# Patient Record
Sex: Male | Born: 1940 | Race: White | Hispanic: No | Smoking: Former smoker
Health system: Southern US, Community
[De-identification: ages and names within clinical notes are randomized; demographics above are authoritative.]

## PROBLEM LIST (undated history)

## (undated) DIAGNOSIS — Z789 Other specified health status: Secondary | ICD-10-CM

## (undated) DIAGNOSIS — U071 COVID-19: Secondary | ICD-10-CM

## (undated) DIAGNOSIS — I493 Ventricular premature depolarization: Secondary | ICD-10-CM

## (undated) HISTORY — DX: Ventricular premature depolarization: I49.3

## (undated) HISTORY — DX: Other specified health status: Z78.9

## (undated) HISTORY — PX: OTHER SURGICAL HISTORY: SHX169

---

## 1997-12-15 ENCOUNTER — Emergency Department (HOSPITAL_COMMUNITY): Admission: EM | Admit: 1997-12-15 | Discharge: 1997-12-15 | Payer: Self-pay | Admitting: Internal Medicine

## 2001-12-30 ENCOUNTER — Encounter (INDEPENDENT_AMBULATORY_CARE_PROVIDER_SITE_OTHER): Payer: Self-pay | Admitting: Specialist

## 2001-12-30 ENCOUNTER — Ambulatory Visit (HOSPITAL_COMMUNITY): Admission: RE | Admit: 2001-12-30 | Discharge: 2001-12-30 | Payer: Self-pay | Admitting: Internal Medicine

## 2010-05-22 ENCOUNTER — Ambulatory Visit (HOSPITAL_COMMUNITY)
Admission: RE | Admit: 2010-05-22 | Discharge: 2010-05-22 | Payer: Self-pay | Source: Home / Self Care | Attending: Internal Medicine | Admitting: Internal Medicine

## 2010-11-27 ENCOUNTER — Other Ambulatory Visit: Payer: Self-pay | Admitting: Dermatology

## 2011-09-03 ENCOUNTER — Encounter: Payer: Self-pay | Admitting: Internal Medicine

## 2011-10-03 ENCOUNTER — Ambulatory Visit (AMBULATORY_SURGERY_CENTER): Payer: Commercial Managed Care - PPO | Admitting: *Deleted

## 2011-10-03 ENCOUNTER — Encounter: Payer: Self-pay | Admitting: Internal Medicine

## 2011-10-03 VITALS — Ht 71.0 in | Wt 219.5 lb

## 2011-10-03 DIAGNOSIS — Z1211 Encounter for screening for malignant neoplasm of colon: Secondary | ICD-10-CM

## 2011-10-03 MED ORDER — PEG-KCL-NACL-NASULF-NA ASC-C 100 G PO SOLR: ORAL | Status: DC

## 2011-10-17 ENCOUNTER — Ambulatory Visit (AMBULATORY_SURGERY_CENTER): Payer: Commercial Managed Care - PPO | Admitting: Internal Medicine

## 2011-10-17 ENCOUNTER — Encounter: Payer: Self-pay | Admitting: Internal Medicine

## 2011-10-17 VITALS — BP 142/84 | HR 87 | Temp 97.8°F | Resp 19 | Ht 71.0 in | Wt 219.0 lb

## 2011-10-17 DIAGNOSIS — K5730 Diverticulosis of large intestine without perforation or abscess without bleeding: Secondary | ICD-10-CM

## 2011-10-17 DIAGNOSIS — Z1211 Encounter for screening for malignant neoplasm of colon: Secondary | ICD-10-CM

## 2011-10-17 DIAGNOSIS — D126 Benign neoplasm of colon, unspecified: Secondary | ICD-10-CM

## 2011-10-17 DIAGNOSIS — Z860100 Personal history of colon polyps, unspecified: Secondary | ICD-10-CM

## 2011-10-17 MED ORDER — SODIUM CHLORIDE 0.9 % IV SOLN: 500.0000 mL | INTRAVENOUS | Status: DC

## 2011-10-17 NOTE — Patient Instructions (Signed)
YOU HAD AN ENDOSCOPIC PROCEDURE TODAY AT THE [facility]: Refer to the procedure report that was given to you for any specific questions about what was found during the examination.  If the procedure report does not answer your questions, please call your gastroenterologist to clarify.  If you requested that your care partner not be given the details of your procedure findings, then the procedure report has been included in a sealed envelope for you to review at your convenience later.  YOU SHOULD EXPECT: Some feelings of bloating in the abdomen. Passage of more gas than usual.  Walking can help get rid of the air that was put into your GI tract during the procedure and reduce the bloating. If you had a lower endoscopy (such as a colonoscopy or flexible sigmoidoscopy) you may notice spotting of blood in your stool or on the toilet paper. If you underwent a bowel prep for your procedure, then you may not have a normal bowel movement for a few days.  DIET: Your first meal following the procedure should be a light meal and then it is ok to progress to your normal diet.  A half-sandwich or bowl of soup is an example of a good first meal.  Heavy or fried foods are harder to digest and may make you feel nauseous or bloated.  Likewise meals heavy in dairy and vegetables can cause extra gas to form and this can also increase the bloating.  Drink plenty of fluids but you should avoid alcoholic beverages for 24 hours.  ACTIVITY: Your care partner should take you home directly after the procedure.  You should plan to take it easy, moving slowly for the rest of the day.  You can resume normal activity the day after the procedure however you should NOT DRIVE or use heavy machinery for 24 hours (because of the sedation medicines used during the test).    SYMPTOMS TO REPORT IMMEDIATELY: A gastroenterologist can be reached at any hour.  During normal business hours, 8:30 AM to 5:00 PM Monday through Friday,  call [phone].  After hours and on weekends, please call the GI answering service at [phone] who will take a message and have the physician on call contact you.   Following lower endoscopy (colonoscopy or flexible sigmoidoscopy):  Excessive amounts of blood in the stool  Significant tenderness or worsening of abdominal pains  Swelling of the abdomen that is new, acute  Fever of 100F or higher  FOLLOW UP: If any biopsies were taken you will be contacted by phone or by letter within the next 1-3 weeks.  Call your gastroenterologist if you have not heard about the biopsies in 3 weeks.  Our staff will call the home number listed on your records the next business day following your procedure to check on you and address any questions or concerns that you may have at that time regarding the information given to you following your procedure. This is a courtesy call and so if there is no answer at the home number and we have not heard from you through the emergency physician on call, we will assume that you have returned to your regular daily activities without incident.  SIGNATURES/CONFIDENTIALITY: You and/or your care partner have signed paperwork which will be entered into your electronic medical record.  These signatures attest to the fact that that the information above on your After Visit Summary has been reviewed and is understood.  Full responsibility of the confidentiality of this   discharge information lies with you and/or your care-partner.  Resume medications. Information given on polyps,hemorrhoids, diverticulosis and high fiber diet. 

## 2011-10-17 NOTE — Op Note (Signed)
[facility] [address] [place], [place]  [address]  COLONOSCOPY PROCEDURE REPORT  PATIENT:  Raymond, Camacho  MR#:  [id number] BIRTHDATE:  10-Oct-1940, 70 yrs. old  GENDER:  male ENDOSCOPIST:  Wilhemina Bonito. Eda Keys, MD REF. BY:  Surveillance Program Recall, PROCEDURE DATE:  10/17/2011 PROCEDURE:  Colonoscopy with snare polypectomy x 1 ASA CLASS:  Class II INDICATIONS:  history of pre-cancerous (adenomatous) colon polyps, surveillance and high-risk screening ; index 2003 w/ TA (overdue for f/u) MEDICATIONS:   MAC sedation, administered by CRNA, propofol (Diprivan) 300 mg IV  DESCRIPTION OF PROCEDURE:   After the risks benefits and alternatives of the procedure were thoroughly explained, informed consent was obtained.  Digital rectal exam was performed and revealed no abnormalities.   The LB CF-[id number] endoscope was introduced through the anus and advanced to the cecum, which was identified by both the appendix and ileocecal valve, without limitations.  The quality of the prep was good, using MoviPrep. The instrument was then slowly withdrawn as the colon was fully examined. <<PROCEDUREIMAGES>>  FINDINGS:  The terminal ileum appeared normal.  A diminutive polyp was found in the mid transverse colon and snared without cautery. Retrieval was successful. Severe diverticulosis was found throughout the colon.  Otherwise normal colonoscopy without other polyps, masses, vascular ectasias, or inflammatory changes. Retroflexed views in the rectum revealed internal hemorrhoids. The time to cecum = 1:30  minutes. The scope was then withdrawn in 13:25  minutes from the cecum and the procedure completed.  COMPLICATIONS:  None  ENDOSCOPIC IMPRESSION: 1) Normal terminal ileum 2) Diminutive polyp in the mid transverse colon - removed 3) Severe diverticulosis throughout the colon 4) Otherwise normal colonoscopy 5) Internal hemorrhoids  RECOMMENDATIONS: 1) Follow up  colonoscopy in 5 years  ______________________________ Wilhemina Bonito. Eda Keys, MD  CC:  Geoffry Paradise, MD;  The Patient  n. eSIGNED:   Wilhemina Bonito. Eda Keys at 10/17/2011 08:43 AM  Craig Guess, [id number]

## 2011-10-17 NOTE — Progress Notes (Signed)
Patient did not experience any of the following events: a burn prior to discharge; a fall within the facility; wrong site/side/patient/procedure/implant event; or a hospital transfer or hospital admission upon discharge from the facility. (G8907) Patient did not have preoperative order for IV antibiotic SSI prophylaxis. (G8918)  

## 2011-10-18 ENCOUNTER — Telehealth: Payer: Self-pay | Admitting: *Deleted

## 2011-10-18 NOTE — Telephone Encounter (Signed)
  Follow up Call-  Call back number 10/17/2011  Post procedure Call Back phone  # [phone]  Permission to leave phone message Yes     Patient questions:  Do you have a fever, pain , or abdominal swelling? no Pain Score  0 *  Have you tolerated food without any problems? yes  Have you been able to return to your normal activities? yes  Do you have any questions about your discharge instructions: Diet   no Medications  no Follow up visit  no  Do you have questions or concerns about your Care? no  Actions: * If pain score is 4 or above: No action needed, pain <4.

## 2011-10-22 ENCOUNTER — Encounter: Payer: Self-pay | Admitting: Internal Medicine

## 2013-03-18 ENCOUNTER — Ambulatory Visit (INDEPENDENT_AMBULATORY_CARE_PROVIDER_SITE_OTHER): Payer: Commercial Managed Care - PPO | Admitting: Cardiology

## 2013-03-18 ENCOUNTER — Encounter: Payer: Self-pay | Admitting: Cardiology

## 2013-03-18 ENCOUNTER — Encounter (HOSPITAL_COMMUNITY): Payer: Self-pay | Admitting: *Deleted

## 2013-03-18 VITALS — BP 142/86 | HR 97 | Ht 71.0 in | Wt 223.0 lb

## 2013-03-18 DIAGNOSIS — R002 Palpitations: Secondary | ICD-10-CM | POA: Insufficient documentation

## 2013-03-18 DIAGNOSIS — I493 Ventricular premature depolarization: Secondary | ICD-10-CM

## 2013-03-18 DIAGNOSIS — I4949 Other premature depolarization: Secondary | ICD-10-CM

## 2013-03-18 DIAGNOSIS — Z87891 Personal history of nicotine dependence: Secondary | ICD-10-CM | POA: Insufficient documentation

## 2013-03-18 DIAGNOSIS — Z8249 Family history of ischemic heart disease and other diseases of the circulatory system: Secondary | ICD-10-CM

## 2013-03-18 DIAGNOSIS — E669 Obesity, unspecified: Secondary | ICD-10-CM | POA: Insufficient documentation

## 2013-03-18 DIAGNOSIS — R9431 Abnormal electrocardiogram [ECG] [EKG]: Secondary | ICD-10-CM

## 2013-03-18 NOTE — Assessment & Plan Note (Signed)
Snores  but no other symptoms of sleep apnea

## 2013-03-18 NOTE — Assessment & Plan Note (Signed)
F- MI in his 50's 

## 2013-03-18 NOTE — Assessment & Plan Note (Signed)
Noted on EKG by primary MD

## 2013-03-18 NOTE — Patient Instructions (Signed)
Your physician recommends that you schedule a follow-up appointment in:2 weeks with Dr Allyson Sabal Your physician has requested that you have an echocardiogram. Echocardiography is a painless test that uses sound waves to create images of your heart. It provides your doctor with information about the size and shape of your heart and how well your heart's chambers and valves are working. This procedure takes approximately one hour. There are no restrictions for this procedure. Your physician has requested that you have en exercise stress myoview. For further information please visit [email or web address] Please follow instruction sheet, as given.

## 2013-03-18 NOTE — Progress Notes (Signed)
03/18/2013 Mardee Postin   12/24/1940  [id number]  Primary Physicia ARONSON,Jamus A, MD Primary Cardiologist: Dr Allyson Sabal (new)  HPI:  72 y/o with no prior history of any heart trouble except palpitations (which have actually been stable the past few years), presented to Dr Jacky Kindle today. His EKG showed NSR with frequent unifocal PVCs. The pt denies any chest pain or SOB. He denies syncope or near syncope.    Current Outpatient Prescriptions  Medication Sig Dispense Refill  . aspirin 81 MG tablet Take 81 mg by mouth daily.      . Omega-3 Fatty Acids (FISH OIL CONCENTRATE PO) Take by mouth daily.       No current facility-administered medications for this visit.    Allergies  Allergen Reactions  . Sulfa Antibiotics Nausea And Vomiting    History   Social History  . Marital Status: Married    Spouse Name: N/A    Number of Children: N/A  . Years of Education: N/A   Occupational History  . Not on file.   Social History Main Topics  . Smoking status: Former Smoker    Quit date: 10/03/1978  . Smokeless tobacco: Never Used  . Alcohol Use: 1.8 oz/week    3 Cans of beer per week  . Drug Use: No  . Sexual Activity: Not on file   Other Topics Concern  . Not on file   Social History Narrative  . No narrative on file     Review of Systems: General: negative for chills, fever, night sweats or weight changes.  Cardiovascular: negative for chest pain, dyspnea on exertion, edema, orthopnea, palpitations, paroxysmal nocturnal dyspnea or shortness of breath Dermatological: negative for rash Respiratory: negative for cough or wheezing Urologic: negative for hematuria, remote kidney stone Abdominal: negative for nausea, vomiting, diarrhea, bright red blood per rectum, melena, or hematemesis Neurologic: negative for visual changes, syncope, or dizziness He snores at night but denies daytime sleepiness All other systems reviewed and are otherwise negative except as noted  above.    Blood pressure 142/86, pulse 97, height 5\' 11"  (1.803 m), weight 223 lb (101.152 kg).  General appearance: alert, cooperative, no distress and moderately obese Neck: no carotid bruit and no JVD Lungs: clear to auscultation bilaterally Heart: regular rate and rhythm, S1, S2 normal, no murmur, click, rub or gallop Abdomen: obese Extremities: extremities normal, atraumatic, no cyanosis or edema Pulses: 2+ and symmetric Skin: Skin color, texture, turgor normal. No rashes or lesions Neurologic: Grossly normal  EKG NSR PVCs  ASSESSMENT AND PLAN:   Palpitations Lifelong problem but worse last few weeks  PVC (premature ventricular contraction) Noted on EKG by primary MD  Smoking history- former heavy smoker He has not smoked since his early 35s but was a 2-3 pkpd smoker  Family history of coronary artery disease F- MI in his 7s  Obesity (BMI 30-39.9) Snores  but no other symptoms of sleep apnea    PLAN  Pt seen by Dr Allyson Sabal and myself- plan echo and Myoview.  [facility] KPA-C 03/18/2013 3:31 PM

## 2013-03-18 NOTE — Assessment & Plan Note (Signed)
Lifelong problem but worse last few weeks

## 2013-03-18 NOTE — Assessment & Plan Note (Signed)
He has not smoked since his early 71s but was a 2-3 pkpd smoker

## 2013-03-20 ENCOUNTER — Encounter: Payer: Self-pay | Admitting: Cardiovascular Disease

## 2013-03-24 ENCOUNTER — Ambulatory Visit (HOSPITAL_COMMUNITY): Payer: Commercial Managed Care - PPO

## 2013-03-26 ENCOUNTER — Telehealth (HOSPITAL_COMMUNITY): Payer: Self-pay | Admitting: *Deleted

## 2013-03-27 ENCOUNTER — Encounter (HOSPITAL_COMMUNITY): Payer: Commercial Managed Care - PPO

## 2013-04-07 ENCOUNTER — Ambulatory Visit: Payer: Commercial Managed Care - PPO | Admitting: Cardiology

## 2016-08-27 ENCOUNTER — Encounter: Payer: Self-pay | Admitting: Internal Medicine

## 2019-04-19 ENCOUNTER — Emergency Department (HOSPITAL_COMMUNITY): Payer: No Typology Code available for payment source

## 2019-04-19 ENCOUNTER — Encounter (HOSPITAL_COMMUNITY): Payer: Self-pay | Admitting: Emergency Medicine

## 2019-04-19 ENCOUNTER — Inpatient Hospital Stay (HOSPITAL_COMMUNITY)
Admission: EM | Admit: 2019-04-19 | Discharge: 2019-04-23 | DRG: 064 | Disposition: A | Discharge status: Home / Self Care | Payer: No Typology Code available for payment source | Attending: Family Medicine | Admitting: Family Medicine

## 2019-04-19 ENCOUNTER — Other Ambulatory Visit: Payer: Self-pay

## 2019-04-19 DIAGNOSIS — N179 Acute kidney failure, unspecified: Secondary | ICD-10-CM | POA: Diagnosis present

## 2019-04-19 DIAGNOSIS — Z23 Encounter for immunization: Secondary | ICD-10-CM

## 2019-04-19 DIAGNOSIS — I6389 Other cerebral infarction: Secondary | ICD-10-CM | POA: Diagnosis not present

## 2019-04-19 DIAGNOSIS — G459 Transient cerebral ischemic attack, unspecified: Secondary | ICD-10-CM | POA: Diagnosis not present

## 2019-04-19 DIAGNOSIS — Z87891 Personal history of nicotine dependence: Secondary | ICD-10-CM | POA: Diagnosis not present

## 2019-04-19 DIAGNOSIS — J1289 Other viral pneumonia: Secondary | ICD-10-CM | POA: Diagnosis present

## 2019-04-19 DIAGNOSIS — U071 COVID-19: Secondary | ICD-10-CM

## 2019-04-19 DIAGNOSIS — Z6828 Body mass index (BMI) 28.0-28.9, adult: Secondary | ICD-10-CM | POA: Diagnosis not present

## 2019-04-19 DIAGNOSIS — Z882 Allergy status to sulfonamides status: Secondary | ICD-10-CM | POA: Diagnosis not present

## 2019-04-19 DIAGNOSIS — E669 Obesity, unspecified: Secondary | ICD-10-CM | POA: Diagnosis present

## 2019-04-19 DIAGNOSIS — I639 Cerebral infarction, unspecified: Secondary | ICD-10-CM | POA: Diagnosis present

## 2019-04-19 DIAGNOSIS — I6340 Cerebral infarction due to embolism of unspecified cerebral artery: Secondary | ICD-10-CM | POA: Diagnosis present

## 2019-04-19 DIAGNOSIS — J9691 Respiratory failure, unspecified with hypoxia: Secondary | ICD-10-CM

## 2019-04-19 DIAGNOSIS — I493 Ventricular premature depolarization: Secondary | ICD-10-CM | POA: Diagnosis present

## 2019-04-19 DIAGNOSIS — Z20828 Contact with and (suspected) exposure to other viral communicable diseases: Secondary | ICD-10-CM | POA: Diagnosis present

## 2019-04-19 DIAGNOSIS — D6859 Other primary thrombophilia: Secondary | ICD-10-CM | POA: Diagnosis present

## 2019-04-19 DIAGNOSIS — Z8619 Personal history of other infectious and parasitic diseases: Secondary | ICD-10-CM

## 2019-04-19 DIAGNOSIS — R2990 Unspecified symptoms and signs involving the nervous system: Secondary | ICD-10-CM

## 2019-04-19 DIAGNOSIS — J1282 Pneumonia due to coronavirus disease 2019: Secondary | ICD-10-CM

## 2019-04-19 DIAGNOSIS — Z7982 Long term (current) use of aspirin: Secondary | ICD-10-CM

## 2019-04-19 DIAGNOSIS — Z8249 Family history of ischemic heart disease and other diseases of the circulatory system: Secondary | ICD-10-CM

## 2019-04-19 DIAGNOSIS — J9601 Acute respiratory failure with hypoxia: Secondary | ICD-10-CM | POA: Diagnosis present

## 2019-04-19 DIAGNOSIS — Z79899 Other long term (current) drug therapy: Secondary | ICD-10-CM

## 2019-04-19 DIAGNOSIS — I6300 Cerebral infarction due to thrombosis of unspecified precerebral artery: Secondary | ICD-10-CM | POA: Diagnosis not present

## 2019-04-19 HISTORY — DX: COVID-19: U07.1

## 2019-04-19 LAB — CBC
HCT: 40.3 % (ref 39.0–52.0)
HCT: 39.7 % (ref 39.0–52.0)
Hemoglobin: 13.7 g/dL (ref 13.0–17.0)
Hemoglobin: 13.5 g/dL (ref 13.0–17.0)
MCH: 30.2 pg (ref 26.0–34.0)
MCH: 30.5 pg (ref 26.0–34.0)
MCHC: 34 g/dL (ref 30.0–36.0)
MCHC: 34 g/dL (ref 30.0–36.0)
MCV: 89 fL (ref 80.0–100.0)
MCV: 89.8 fL (ref 80.0–100.0)
Platelets: 358 10*3/uL (ref 150–400)
Platelets: 435 10*3/uL — ABNORMAL HIGH (ref 150–400)
RBC: 4.53 MIL/uL (ref 4.22–5.81)
RBC: 4.42 MIL/uL (ref 4.22–5.81)
RDW: 12.2 % (ref 11.5–15.5)
RDW: 12.5 % (ref 11.5–15.5)
WBC: 9.5 10*3/uL (ref 4.0–10.5)
WBC: 8.9 10*3/uL (ref 4.0–10.5)
nRBC: 0 % (ref 0.0–0.2)
nRBC: 0 % (ref 0.0–0.2)

## 2019-04-19 LAB — DIFFERENTIAL
Abs Immature Granulocytes: 0.11 10*3/uL — ABNORMAL HIGH (ref 0.00–0.07)
Basophils Absolute: 0 10*3/uL (ref 0.0–0.1)
Basophils Relative: 0 %
Eosinophils Absolute: 0.1 10*3/uL (ref 0.0–0.5)
Eosinophils Relative: 1 %
Immature Granulocytes: 1 %
Lymphocytes Relative: 14 %
Lymphs Abs: 1.4 10*3/uL (ref 0.7–4.0)
Monocytes Absolute: 1.4 10*3/uL — ABNORMAL HIGH (ref 0.1–1.0)
Monocytes Relative: 14 %
Neutro Abs: 6.5 10*3/uL (ref 1.7–7.7)
Neutrophils Relative %: 70 %

## 2019-04-19 LAB — C-REACTIVE PROTEIN: CRP: 11.4 mg/dL — ABNORMAL HIGH (ref <1.0)

## 2019-04-19 LAB — PROTIME-INR
INR: 1.2 (ref 0.8–1.2)
Prothrombin Time: 15.1 seconds (ref 11.4–15.2)

## 2019-04-19 LAB — COMPREHENSIVE METABOLIC PANEL
ALT: 74 U/L — ABNORMAL HIGH (ref 0–44)
AST: 52 U/L — ABNORMAL HIGH (ref 15–41)
Albumin: 2.5 g/dL — ABNORMAL LOW (ref 3.5–5.0)
Alkaline Phosphatase: 63 U/L (ref 38–126)
Anion gap: 12 (ref 5–15)
BUN: 31 mg/dL — ABNORMAL HIGH (ref 8–23)
CO2: 22 mmol/L (ref 22–32)
Calcium: 8.8 mg/dL — ABNORMAL LOW (ref 8.9–10.3)
Chloride: 102 mmol/L (ref 98–111)
Creatinine, Ser: 1.19 mg/dL (ref 0.61–1.24)
GFR calc Af Amer: >60 mL/min (ref >60)
GFR calc non Af Amer: 58 mL/min — ABNORMAL LOW (ref >60)
Glucose, Bld: 92 mg/dL (ref 70–99)
Potassium: 4.4 mmol/L (ref 3.5–5.1)
Sodium: 136 mmol/L (ref 135–145)
Total Bilirubin: 0.8 mg/dL (ref 0.3–1.2)
Total Protein: 6.6 g/dL (ref 6.5–8.1)

## 2019-04-19 LAB — CREATININE, SERUM
Creatinine, Ser: 1.32 mg/dL — ABNORMAL HIGH (ref 0.61–1.24)
GFR calc Af Amer: 59 mL/min — ABNORMAL LOW (ref >60)
GFR calc non Af Amer: 51 mL/min — ABNORMAL LOW (ref >60)

## 2019-04-19 LAB — ABO/RH: ABO/RH(D): O POS

## 2019-04-19 LAB — HEMOGLOBIN A1C
Hgb A1c MFr Bld: 6.6 % — ABNORMAL HIGH (ref 4.8–5.6)
Mean Plasma Glucose: 142.72 mg/dL

## 2019-04-19 LAB — FERRITIN: Ferritin: 862 ng/mL — ABNORMAL HIGH (ref 24–336)

## 2019-04-19 LAB — APTT: aPTT: 36 seconds (ref 24–36)

## 2019-04-19 LAB — SARS CORONAVIRUS 2 (TAT 6-24 HRS): SARS Coronavirus 2: NEGATIVE

## 2019-04-19 MED ORDER — SODIUM CHLORIDE 0.9 % IV SOLN
100.0000 mg | Freq: Every day | INTRAVENOUS | Status: AC
  Administered 2019-04-20 – 2019-04-23 (×4): 100 mg via INTRAVENOUS
  Filled 2019-04-19: qty 20
  Filled 2019-04-19: qty 100
  Filled 2019-04-19: qty 20
  Filled 2019-04-19: qty 100

## 2019-04-19 MED ORDER — ZINC SULFATE 220 (50 ZN) MG PO CAPS
220.0000 mg | ORAL_CAPSULE | Freq: Every day | ORAL | Status: DC
  Administered 2019-04-19 – 2019-04-23 (×5): 220 mg via ORAL
  Filled 2019-04-19 (×5): qty 1

## 2019-04-19 MED ORDER — ACETAMINOPHEN 325 MG PO TABS: 650.0000 mg | ORAL_TABLET | Freq: Four times a day (QID) | ORAL | Status: DC | PRN

## 2019-04-19 MED ORDER — ENOXAPARIN SODIUM 40 MG/0.4ML ~~LOC~~ SOLN
40.0000 mg | SUBCUTANEOUS | Status: DC
  Administered 2019-04-19: 40 mg via SUBCUTANEOUS
  Filled 2019-04-19: qty 0.4

## 2019-04-19 MED ORDER — ATORVASTATIN CALCIUM 80 MG PO TABS
80.0000 mg | ORAL_TABLET | Freq: Every day | ORAL | Status: DC
  Administered 2019-04-20: 80 mg via ORAL
  Filled 2019-04-19 (×2): qty 1

## 2019-04-19 MED ORDER — SODIUM CHLORIDE 0.9% FLUSH: 3.0000 mL | Freq: Once | INTRAVENOUS | Status: DC

## 2019-04-19 MED ORDER — ALBUTEROL SULFATE HFA 108 (90 BASE) MCG/ACT IN AERS
2.0000 | INHALATION_SPRAY | Freq: Four times a day (QID) | RESPIRATORY_TRACT | Status: DC
  Administered 2019-04-19 – 2019-04-23 (×14): 2 via RESPIRATORY_TRACT
  Filled 2019-04-19 (×2): qty 6.7

## 2019-04-19 MED ORDER — ASPIRIN EC 325 MG PO TBEC
325.0000 mg | DELAYED_RELEASE_TABLET | Freq: Every day | ORAL | Status: DC
  Administered 2019-04-19 – 2019-04-20 (×2): 325 mg via ORAL
  Filled 2019-04-19 (×2): qty 1

## 2019-04-19 MED ORDER — SODIUM CHLORIDE 0.9 % IV SOLN
200.0000 mg | Freq: Once | INTRAVENOUS | Status: AC
  Administered 2019-04-19: 19:00:00 200 mg via INTRAVENOUS
  Filled 2019-04-19: qty 40

## 2019-04-19 MED ORDER — ACETAMINOPHEN 650 MG RE SUPP: 650.0000 mg | Freq: Four times a day (QID) | RECTAL | Status: DC | PRN

## 2019-04-19 MED ORDER — VITAMIN C 500 MG PO TABS
1000.0000 mg | ORAL_TABLET | Freq: Every day | ORAL | Status: DC
  Administered 2019-04-19 – 2019-04-23 (×5): 1000 mg via ORAL
  Filled 2019-04-19 (×5): qty 2

## 2019-04-19 MED ORDER — DEXAMETHASONE 6 MG PO TABS
6.0000 mg | ORAL_TABLET | ORAL | Status: DC
  Administered 2019-04-19 – 2019-04-22 (×4): 6 mg via ORAL
  Filled 2019-04-19 (×3): qty 1
  Filled 2019-04-19 (×2): qty 2

## 2019-04-19 NOTE — ED Provider Notes (Signed)
Care of patient assumed from [facility] at 3:40 PM.  Agree with history, physical exam and plan.  See their note for further details.  Briefly, 78 y.o. male with PMH/PSH as below who presents with L hand and leg weakness.   24h ago (3P yesterday); L sided hand weakness that resolved after 81min; also has had L sided leg weakness that patient still appreciates, but not appreciated on exam  Of note, had Covid + one week ago, mild symptoms  CTH normal  Per neuro: will obtain MRI  Past Medical History:  Diagnosis Date  . COVID-19   . Current non-smoker but past smoking history unknown   . PVC's (premature ventricular contractions)    Past Surgical History:  Procedure Laterality Date  . no prior surgery        Current Plan: MR Brain and discuss with neuro    MDM/ED Course: Patient resting in bed in no acute distress No appreciable weakness on examination the patient states he feels like he has poor coordination when raising and lowering his left leg.  Neurology has seen and evaluated the patient and recommends admission to general medicine for TIA workup.   Regarding recent Covid 19 infection, CXR with scattered opacities. Pt is comfortable on RA in no acute respiratory distress.   Hospitalist contacted for admission. Patient awaiting MR transport. Care assumed by hospitalist. No further acute events.    Consults: Neurology    Significant labs/images:  I personally reviewed and interpreted all labs.  The plan for this patient was discussed with Dr. Steffanie Dunn, who voiced agreement and who oversaw evaluation and treatment of this patient.    Burns Spain, MD 04/19/19 [id number]    Elnora Morrison, MD 04/20/19 [phone]

## 2019-04-19 NOTE — Consult Note (Addendum)
Neurology consult  Reason for Consult: Left-sided weakness Referring Physician: Dr. Rudene Re Raymond Camacho is an 78 y.o. male.  HPI: 78 year old man, veteran of the [employer], past medical history of PVCs, recent diagnosis of COVID-19 in April 10, 2019 when he got tested because of some respiratory symptoms but did not require hospitalization, with greater than 24 hours last known normal presented for evaluation of left-sided weakness. He states that his left hand was unable to open a breakfast bar cover when he attempted to do that yesterday morning around 10 AM.  He then felt that he was not able to walk well with weakness of his left leg.  The left leg weakness started to improve but the arm remains weak for quite a while.  He could not give me an exact time.  He says that his arm still does not feel completely normal subjectively. Still continues to be short of breath after the Covid diagnosis in late November. Other review of system documented below  Past Medical History:  Diagnosis Date  . COVID-19   . Current non-smoker but past smoking history unknown   . PVC's (premature ventricular contractions)     Past Surgical History:  Procedure Laterality Date  . no prior surgery      Family History  Problem Relation Age of Onset  . CAD Father 31  . Colon cancer Neg Hx   . Stomach cancer Neg Hx     Social History:  reports that he quit smoking about 40 years ago. He has never used smokeless tobacco. He reports current alcohol use of about 3.0 standard drinks of alcohol per week. He reports that he does not use drugs.  Allergies:  Allergies  Allergen Reactions  . Sulfa Antibiotics Nausea And Vomiting    Medications: Prior to Admission: Aspirin, flecainide  Results for orders placed or performed during the hospital encounter of 04/19/19 (from the past 48 hour(s))  Protime-INR     Status: None   Collection Time: 04/19/19 12:16 PM  Result Value Ref Range   Prothrombin Time  15.1 11.4 - 15.2 seconds   INR 1.2 0.8 - 1.2    Comment: (NOTE) INR goal varies based on device and disease states. Performed at [facility], [place] [address] [place], [place] [address]   APTT     Status: None   Collection Time: 04/19/19 12:16 PM  Result Value Ref Range   aPTT 36 24 - 36 seconds    Comment: Performed at [facility] [address], [place] [address]  CBC     Status: None   Collection Time: 04/19/19 12:16 PM  Result Value Ref Range   WBC 9.5 4.0 - 10.5 K/uL   RBC 4.53 4.22 - 5.81 MIL/uL   Hemoglobin 13.7 13.0 - 17.0 g/dL   HCT 40.3 39.0 - 52.0 %   MCV 89.0 80.0 - 100.0 fL   MCH 30.2 26.0 - 34.0 pg   MCHC 34.0 30.0 - 36.0 g/dL   RDW 12.2 11.5 - 15.5 %   Platelets 358 150 - 400 K/uL   nRBC 0.0 0.0 - 0.2 %    Comment: Performed at [facility], [place] [address], [place], [place] [address]  Differential     Status: Abnormal   Collection Time: 04/19/19 12:16 PM  Result Value Ref Range   Neutrophils Relative % 70 %   Neutro Abs 6.5 1.7 - 7.7 K/uL   Lymphocytes Relative 14 %  Lymphs Abs 1.4 0.7 - 4.0 K/uL   Monocytes Relative 14 %   Monocytes Absolute 1.4 (H) 0.1 - 1.0 K/uL   Eosinophils Relative 1 %   Eosinophils Absolute 0.1 0.0 - 0.5 K/uL   Basophils Relative 0 %   Basophils Absolute 0.0 0.0 - 0.1 K/uL   Immature Granulocytes 1 %   Abs Immature Granulocytes 0.11 (H) 0.00 - 0.07 K/uL    Comment: Performed at [facility] [address], [place], [place] [address]  Comprehensive metabolic panel     Status: Abnormal   Collection Time: 04/19/19 12:16 PM  Result Value Ref Range   Sodium 136 135 - 145 mmol/L   Potassium 4.4 3.5 - 5.1 mmol/L   Chloride 102 98 - 111 mmol/L   CO2 22 22 - 32 mmol/L   Glucose, Bld 92 70 - 99 mg/dL   BUN 31 (H) 8 - 23 mg/dL   Creatinine, Ser 1.19 0.61 - 1.24 mg/dL   Calcium 8.8 (L) 8.9 - 10.3 mg/dL   Total Protein 6.6 6.5 - 8.1 g/dL   Albumin 2.5 (L) 3.5 - 5.0 g/dL   AST 52 (H) 15 - 41 U/L    ALT 74 (H) 0 - 44 U/L   Alkaline Phosphatase 63 38 - 126 U/L   Total Bilirubin 0.8 0.3 - 1.2 mg/dL   GFR calc non Af Amer 58 (L) >60 mL/min   GFR calc Af Amer >60 >60 mL/min   Anion gap 12 5 - 15    Comment: Performed at [facility] [address], [place], [place] [address]    Ct Head Wo Contrast  Result Date: 04/19/2019 CLINICAL DATA:  Possible stroke, onset of lightheadedness yesterday at 1600 hours, diagnosed with COVID-19 1 week ago, former smoker EXAM: CT HEAD WITHOUT CONTRAST TECHNIQUE: Contiguous axial images were obtained from the base of the skull through the vertex without intravenous contrast. Sagittal and coronal MPR images reconstructed from axial data set. COMPARISON:  MR head 06/25/2006 FINDINGS: Brain: Normal ventricular morphology. No midline shift or mass effect. Normal appearance of brain parenchyma. No intracranial hemorrhage, mass lesion or evidence of acute infarction. No extra-axial fluid collections. Vascular: No hyperdense vessels Skull: Intact Sinuses/Orbits: Clear Other: N/A IMPRESSION: Normal exam. Electronically Signed   By: Lavonia Dana M.D.   On: 04/19/2019 15:19    Review of Systems  Constitutional: Negative for chills and fever.  HENT: Negative for hearing loss and tinnitus.   Eyes: Negative for blurred vision, double vision and photophobia.  Respiratory: Positive for cough and shortness of breath.   Gastrointestinal: Negative for heartburn and nausea.  Genitourinary: Negative for dysuria and urgency.  Skin: Negative for itching and rash.  Neurological: Positive for sensory change and focal weakness.  Endo/Heme/Allergies: Negative for environmental allergies and polydipsia. Does not bruise/bleed easily.  Psychiatric/Behavioral: Negative for depression and suicidal ideas.   Blood pressure 129/72, pulse 86, temperature 98 F (36.7 C), temperature source Oral, resp. rate (!) 22, SpO2 95 %. Physical Exam General exam: Well-developed well-nourished in  no distress HEENT: Normocephalic atraumatic CVS: Regular rate rhythm Respiratory: Scattered wheezing and rales Abdomen: Nondistended nontender Extremities warm well perfused Neurological exam Awake alert oriented x3 Speech is nondysarthric Naming comprehension repetition intact Cranial: Pupils equal round reactive light, extraocular movements intact, visual fields full, question left subtle lower facial weakness.  Hearing intact.  Tongue and palate midline Motor exam: No drift in any of the 4 extremities but 4+/5 left leg.  Otherwise 5/5. Sensory exam: Intact to touch all over but says subjectively the left side does not feel as good as right although could not tell me that my light touch was any different on both arms. Coordination: No dysmetria on finger-nose-finger. Gait testing deferred at this time NIH stroke scale-2 (1 for facial, 1 for sensory)  I have independently reviewed imaging: CT head: No acute changes.  Assessment/Plan: 78 year old man past history of PVCs, recent diagnosis of COVID-19 on November 27,, 2020, presenting with sudden onset of left-sided weakness and sensory changes. Last known well was around 10 AM yesterday 04/18/2019 Outside the window for IV TPA No exam findings of large vessel occlusion hence not a candidate for EVT. Likely small vessel stroke versus TIA.  Scattered embolic strokes cannot be ruled out. Will recommend admission, more so with a recent diagnosis of COVID-19 which has been shown to be prothrombotic as well as the fact that he still continues to be short of breath and probably requires oxygen and would benefit from medical assessment.  Recommendations: Admit to hospitalist Telemetry MRI brain without contrast Vessel imaging - MRA  head/neck Aspirin 325 Atorvastatin 80 2D echo A1c Lipid panel PT OT Speech therapy N.p.o. until cleared by bedside swallow evaluation or formal swallow evaluation Management of COVID-19 related symptoms  per primary team. Plan relayed to Dr. Baltazar Najjar, ED resident and the emergency department. Stroke team will follow  -- Amie Portland, MD [facility] Pager: [phone] If 7pm to 7am, please call on call as listed on AMION.  04/19/2019, 4:30 PM

## 2019-04-19 NOTE — H&P (Signed)
History and Physical    BOOMER DODARO [id number] DOB: 01/18/1941 DOA: 04/19/2019  PCP: Burnard Bunting, MD  Patient coming from: Home  Chief Complaint: L sided weakness  HPI: Raymond Camacho is a 78 y.o. male with medical history significant of PVC's presenting with acute onset L sided weakness that started the afternoon on the day prior to admit. Pt recalled first noticing difficulty holding utensils with his left hand and later in the day progressing to LLE feeling "like Jell-O." Symptoms did not resolve and pt subsequently presented to ED for further work up.   Of note pt has a hx of feeling "like run over by a truck" and fever as high as 101F over the past week. Pt reports occasional cough worse when speaking. Pt was later seen at the [place] where he was reportedly tested positive for COVID on 11/27.  ED Course: In the ED, pt noted to have unremarkable head CT. Neurology was consulted by EDP with recommendations for MRI, currently pending. COVID testing in ED pending. CXR notable for bilateral patchy peripheral interstitial opacities compatible with COVID. Hospitalist consulted for consideration for admission.  Review of Systems:  Review of Systems  Constitutional: Positive for fever and malaise/fatigue.  HENT: Negative for ear discharge, ear pain and nosebleeds.   Eyes: Negative for double vision, discharge and redness.  Respiratory: Positive for cough. Negative for sputum production and shortness of breath.   Cardiovascular: Negative for chest pain, palpitations and orthopnea.  Gastrointestinal: Negative for nausea and vomiting.  Musculoskeletal: Negative for back pain, joint pain and neck pain.  Neurological: Positive for focal weakness and weakness. Negative for sensory change and loss of consciousness.  Psychiatric/Behavioral: Negative for hallucinations and substance abuse. The patient is not nervous/anxious.     Past Medical History:  Diagnosis Date  . COVID-19   .  Current non-smoker but past smoking history unknown   . PVC's (premature ventricular contractions)     Past Surgical History:  Procedure Laterality Date  . no prior surgery       reports that he quit smoking about 40 years ago. He has never used smokeless tobacco. He reports current alcohol use of about 3.0 standard drinks of alcohol per week. He reports that he does not use drugs.  Allergies  Allergen Reactions  . Sulfa Antibiotics Nausea And Vomiting    Family History  Problem Relation Age of Onset  . CAD Father 54  . Colon cancer Neg Hx   . Stomach cancer Neg Hx     Prior to Admission medications   Medication Sig Start Date End Date Taking? Authorizing Provider  acetaminophen (TYLENOL) 325 MG tablet Take 650-975 mg by mouth every 4 (four) hours as needed for fever or headache (pain).   Yes [provider]  aspirin EC 81 MG tablet Take 81 mg by mouth daily.   Yes [provider]  flecainide (TAMBOCOR) 50 MG tablet Take 50 mg by mouth 2 (two) times daily.   Yes [provider]  ibuprofen (ADVIL) 200 MG tablet Take 400-600 mg by mouth every 4 (four) hours as needed for fever or headache (pain).   Yes [provider]  Javier Docker Oil 500 MG CAPS Take 500 mg by mouth daily.   Yes [provider]  Oxymetazoline HCl (VICKS SINEX NA) Place 1 spray into both nostrils daily as needed (congestion/shortness of breath).   Yes [provider]    Physical Exam: Vitals:   04/19/19 1400 04/19/19 1500 04/19/19  1600 04/19/19 1630  BP: 128/72 129/72 138/69 129/88  Pulse: 81 86 84   Resp: 16 (!) 22 19 16   Temp:      TempSrc:      SpO2: 95% 95% 91%     Constitutional: NAD, calm, comfortable Vitals:   04/19/19 1400 04/19/19 1500 04/19/19 1600 04/19/19 1630  BP: 128/72 129/72 138/69 129/88  Pulse: 81 86 84   Resp: 16 (!) 22 19 16   Temp:      TempSrc:      SpO2: 95% 95% 91%    Eyes: PERRL, lids and conjunctivae normal ENMT: Mucous  membranes are moist. Posterior pharynx clear of any exudate or lesions.Normal dentition.  Neck: normal, supple Respiratory: clear to auscultation bilaterally, no audible wheezing. Normal respiratory effort. No accessory muscle use.  Cardiovascular: Regular rate and rhythm,No extremity edema. 2+ pedal pulses.  Abdomen: no tenderness, nondistended Musculoskeletal: no clubbing / cyanosis. No joint deformity upper and lower extremities. Good ROM, no contractures. Normal muscle tone.  Skin: no rashes, lesions, No induration Neurologic: CN 2-12 grossly intact. Sensation intact, no tremors  Psychiatric: Normal judgment and insight. Alert and oriented x 3. Normal mood.    Labs on Admission: I have personally reviewed following labs and imaging studies  CBC: Recent Labs  Lab 04/19/19 1216  WBC 9.5  NEUTROABS 6.5  HGB 13.7  HCT 40.3  MCV 89.0  PLT 123456   Basic Metabolic Panel: Recent Labs  Lab 04/19/19 1216  NA 136  K 4.4  CL 102  CO2 22  GLUCOSE 92  BUN 31*  CREATININE 1.19  CALCIUM 8.8*   GFR: CrCl cannot be calculated (Unknown ideal weight.). Liver Function Tests: Recent Labs  Lab 04/19/19 1216  AST 52*  ALT 74*  ALKPHOS 63  BILITOT 0.8  PROT 6.6  ALBUMIN 2.5*   No results for input(s): LIPASE, AMYLASE in the last 168 hours. No results for input(s): AMMONIA in the last 168 hours. Coagulation Profile: Recent Labs  Lab 04/19/19 1216  INR 1.2   Cardiac Enzymes: No results for input(s): CKTOTAL, CKMB, CKMBINDEX, TROPONINI in the last 168 hours. BNP (last 3 results) No results for input(s): PROBNP in the last 8760 hours. HbA1C: No results for input(s): HGBA1C in the last 72 hours. CBG: No results for input(s): GLUCAP in the last 168 hours. Lipid Profile: No results for input(s): CHOL, HDL, LDLCALC, TRIG, CHOLHDL, LDLDIRECT in the last 72 hours. Thyroid Function Tests: No results for input(s): TSH, T4TOTAL, FREET4, T3FREE, THYROIDAB in the last 72 hours. Anemia  Panel: No results for input(s): VITAMINB12, FOLATE, FERRITIN, TIBC, IRON, RETICCTPCT in the last 72 hours. Urine analysis: No results found for: COLORURINE, APPEARANCEUR, LABSPEC, PHURINE, GLUCOSEU, HGBUR, BILIRUBINUR, KETONESUR, PROTEINUR, UROBILINOGEN, NITRITE, LEUKOCYTESUR Sepsis Labs: !!!!!!!!!!!!!!!!!!!!!!!!!!!!!!!!!!!!!!!!!!!! @LABRCNTIP (procalcitonin:4,lacticidven:4) )No results found for this or any previous visit (from the past 240 hour(s)).   Radiological Exams on Admission: Ct Head Wo Contrast  Result Date: 04/19/2019 CLINICAL DATA:  Possible stroke, onset of lightheadedness yesterday at 1600 hours, diagnosed with COVID-19 1 week ago, former smoker EXAM: CT HEAD WITHOUT CONTRAST TECHNIQUE: Contiguous axial images were obtained from the base of the skull through the vertex without intravenous contrast. Sagittal and coronal MPR images reconstructed from axial data set. COMPARISON:  MR head 06/25/2006 FINDINGS: Brain: Normal ventricular morphology. No midline shift or mass effect. Normal appearance of brain parenchyma. No intracranial hemorrhage, mass lesion or evidence of acute infarction. No extra-axial fluid collections. Vascular: No hyperdense vessels Skull: Intact Sinuses/Orbits: Clear Other:  N/A IMPRESSION: Normal exam. Electronically Signed   By: Lavonia Dana M.D.   On: 04/19/2019 15:19   Dg Chest Portable 1 View  Result Date: 04/19/2019 CLINICAL DATA:  Shortness of breath EXAM: PORTABLE CHEST 1 VIEW COMPARISON:  None. FINDINGS: The heart size and mediastinal contours are within normal limits accounting for technique. Moderate patchy bilateral peripheral predominant interstitial and airspace opacities are noted. There is no pleural effusion or pneumothorax. The visualized skeletal structures are unremarkable. IMPRESSION: 1. Moderate patchy bilateral peripheral predominant interstitial and airspace opacities compatible with COVID-19 pneumonia. Electronically Signed   By: Zerita Boers  M.D.   On: 04/19/2019 17:25    EKG: Independently reviewed. Sinus tach  Assessment/Plan Active Problems:   Stroke ([place])   1. Stroke 1. Pt with presenting L sided weakness 2. Neurology consulted and is following 3. CT head personally reviewed, unremarakble 4. MRI head and neck ordered, pending 5. Pt passed bedside swallow, will allow diet 6. PT/OT consulted 7. Follow up 2d echo 8. Continue ASA 325mg  daily and atorvastatin 80mg  per Neurology recs 9. F/u A1c and lipid panel 10. Suspect may be related to hypercoagulable state from below COVID 2. COVID pneumonia 1. Pt reportedly tested pos for COVID at [facility] facility recently, have requested records to be faxed 2. CXR reviewed. Findings of B infiltrates. Pt coughing and with myalgias. O2 sat of 89% 3. Will start dexamethasone 6mg  x 10 days and remdesivir 3. Sinus tach 1. Seems stable at this time 2. Continue on tele  DVT prophylaxis: Lovenox subq  Code Status: Full Family Communication: Pt in room, family not at bedside  Disposition Plan: Uncertain at this time  Consults called: Neurology, stroke team Admission status: Inpatient as would likely require greater that 2 midnight stay to treat covid and for stroke w/u    Marylu Lund MD [facility] Pager On Amion  If 7PM-7AM, please contact night-coverage  04/19/2019, 5:45 PM

## 2019-04-19 NOTE — ED Provider Notes (Signed)
[facility] Provider Note   CSN: [id number] Arrival date & time: 04/19/19  1151     History   Chief Complaint Chief Complaint  Patient presents with   COVID +   Stroke Symptoms    HPI Raymond Camacho is a 78 y.o. male with positive COVID-19 test 04/10/2019, obesity presents to ED with a chief complaint of left-sided weakness.  Approximately 22 hours ago, patient was sitting down and attempting to eat a breakfast bar.  States that suddenly "my left hand became useless."  He then tried to walk over to the other side of the room and felt like "my left leg was dragging."  States the weakness in his left hand has gradually improved after 5 minutes.  However he still endorses some left lower extremity weakness but remains ambulatory.  He is concerned that he had a "mini stroke."  Denies history of CVA or TIA in the past, head injuries or falls, anticoagulant use, shortness of breath, chest pain, vision changes, loss of vision, difficulty speaking.     HPI  Past Medical History:  Diagnosis Date   COVID-19    Current non-smoker but past smoking history unknown    PVC's (premature ventricular contractions)     Patient Active Problem List   Diagnosis Date Noted   Palpitations 03/18/2013   PVC (premature ventricular contraction) 03/18/2013   Smoking history- former heavy smoker 03/18/2013   Family history of coronary artery disease 03/18/2013   Obesity (BMI 30-39.9) 03/18/2013    Past Surgical History:  Procedure Laterality Date   no prior surgery          Home Medications    Prior to Admission medications   Medication Sig Start Date End Date Taking? Authorizing Provider  aspirin 81 MG tablet Take 81 mg by mouth daily.    [provider]  Omega-3 Fatty Acids (FISH OIL CONCENTRATE PO) Take by mouth daily.    [provider]    Family History Family History  Problem Relation Age of Onset   CAD Father 22    Colon cancer Neg Hx    Stomach cancer Neg Hx     Social History Social History   Tobacco Use   Smoking status: Former Smoker    Quit date: 10/03/1978    Years since quitting: 40.5   Smokeless tobacco: Never Used  Substance Use Topics   Alcohol use: Yes    Alcohol/week: 3.0 standard drinks    Types: 3 Cans of beer per week   Drug use: No     Allergies   Sulfa antibiotics   Review of Systems Review of Systems  Constitutional: Negative for appetite change, chills and fever.  HENT: Negative for ear pain, rhinorrhea, sneezing and sore throat.   Eyes: Negative for photophobia and visual disturbance.  Respiratory: Negative for cough, chest tightness, shortness of breath and wheezing.   Cardiovascular: Negative for chest pain and palpitations.  Gastrointestinal: Negative for abdominal pain, blood in stool, constipation, diarrhea, nausea and vomiting.  Genitourinary: Negative for dysuria, hematuria and urgency.  Musculoskeletal: Negative for myalgias.  Skin: Negative for rash.  Neurological: Positive for weakness and numbness. Negative for dizziness and light-headedness.     Physical Exam Updated Vital Signs BP 129/72    Pulse 86    Temp 98 F (36.7 C) (Oral)    Resp (!) 22    SpO2 95%   Physical Exam Vitals signs and nursing note reviewed.  Constitutional:  General: He is not in acute distress.    Appearance: He is well-developed.  HENT:     Head: Normocephalic and atraumatic.     Nose: Nose normal.  Eyes:     General: No scleral icterus.       Right eye: No discharge.        Left eye: No discharge.     Conjunctiva/sclera: Conjunctivae normal.     Pupils: Pupils are equal, round, and reactive to light.  Neck:     Musculoskeletal: Normal range of motion and neck supple.  Cardiovascular:     Rate and Rhythm: Normal rate and regular rhythm.     Heart sounds: Normal heart sounds. No murmur. No friction rub. No gallop.   Pulmonary:     Effort: Pulmonary  effort is normal. No respiratory distress.     Breath sounds: Normal breath sounds.  Abdominal:     General: Bowel sounds are normal. There is no distension.     Palpations: Abdomen is soft.     Tenderness: There is no abdominal tenderness. There is no guarding.  Musculoskeletal: Normal range of motion.  Skin:    General: Skin is warm and dry.     Findings: No rash.  Neurological:     General: No focal deficit present.     Mental Status: He is alert and oriented to person, place, and time.     Cranial Nerves: No cranial nerve deficit.     Sensory: No sensory deficit.     Motor: No weakness or abnormal muscle tone.     Coordination: Coordination normal.     Comments: Pupils reactive. No facial asymmetry noted. Cranial nerves appear grossly intact. Sensation intact to light touch on face, BUE and BLE. Strength 5/5 in BUE and BLE. No aphasia. Normal finger to nose coordination bilaterally.      ED Treatments / Results  Labs (all labs ordered are listed, but only abnormal results are displayed) Labs Reviewed  DIFFERENTIAL - Abnormal; Notable for the following components:      Result Value   Monocytes Absolute 1.4 (*)    Abs Immature Granulocytes 0.11 (*)    All other components within normal limits  COMPREHENSIVE METABOLIC PANEL - Abnormal; Notable for the following components:   BUN 31 (*)    Calcium 8.8 (*)    Albumin 2.5 (*)    AST 52 (*)    ALT 74 (*)    GFR calc non Af Amer 58 (*)    All other components within normal limits  PROTIME-INR  APTT  CBC    EKG EKG Interpretation  Date/Time:  Sunday April 19 2019 12:25:02 EST Ventricular Rate:  104 PR Interval:    QRS Duration: 96 QT Interval:  361 QTC Calculation: 475 R Axis:   11 Text Interpretation: Sinus tachycardia Probable left atrial enlargement Abnormal R-wave progression, early transition No STEMI Confirmed by Octaviano Glow [phone]) on 04/19/2019 2:57:59 PM   Radiology Ct Head Wo Contrast  Result  Date: 04/19/2019 CLINICAL DATA:  Possible stroke, onset of lightheadedness yesterday at 1600 hours, diagnosed with COVID-19 1 week ago, former smoker EXAM: CT HEAD WITHOUT CONTRAST TECHNIQUE: Contiguous axial images were obtained from the base of the skull through the vertex without intravenous contrast. Sagittal and coronal MPR images reconstructed from axial data set. COMPARISON:  MR head 06/25/2006 FINDINGS: Brain: Normal ventricular morphology. No midline shift or mass effect. Normal appearance of brain parenchyma. No intracranial hemorrhage, mass lesion or evidence of  acute infarction. No extra-axial fluid collections. Vascular: No hyperdense vessels Skull: Intact Sinuses/Orbits: Clear Other: N/A IMPRESSION: Normal exam. Electronically Signed   By: Lavonia Dana M.D.   On: 04/19/2019 15:19    Procedures Procedures (including critical care time)  Medications Ordered in ED Medications  sodium chloride flush (NS) 0.9 % injection 3 mL (has no administration in time range)     Initial Impression / Assessment and Plan / ED Course  I have reviewed the triage vital signs and the nursing notes.  Pertinent labs & imaging results that were available during my care of the patient were reviewed by me and considered in my medical decision making (see chart for details).  Clinical Course as of Apr 19 1523  Sun Apr 19, 2019  1346 Patient seen by myself as well as PA.  Briefly is a 78 year old male with a history of Covid diagnosis 2 weeks ago, presenting to the ED with left hand numbness and left leg weakness which was episodic yesterday morning.  He describes that this event occurred while at the breakfast table.  He says left hand became useless to seem to fall asleep.  Mature to get from the table he felt his left leg was weak and was dragging his foot.  He sat down on the couch and took a nap.  When he woke up his symptoms had improved but he continues to feel weakness left leg.  He denies a headache.  He  denies any history of stroke or seizure.  Denies any history of high cholesterol smoking.  He states he been chronically short of breath since being diagnosed with Covid, but no changes in his respiratory status.  On exam the patient appears comfortable.  He is not in respiratory distress.  He has no focal neurological deficits on my exam.  He is pending CT scan of the head and may need an MRI to rule out TIA.   [MT]  1518 Spoke to Dr. Rory Percy, neurologist.  Recommends reconsultation after MRI of the brain.   [HK]    Clinical Course User Index [HK] Delia Heady, PA-C [MT] Langston Masker, Carola Rhine, MD       78 year old male presents to ED with a chief complaint of left arm weakness and left leg weakness that began yesterday.  His left upper extremity weakness resolved after 5 minutes yesterday.  He endorses some residual left-sided weakness is concerned that he had a TIA.  He denies any headache, vision changes, injuries or falls.  Denies history of CVA or TIA in the past.  On my exam there are no appreciable neurological deficits.  He is being complete sentences without difficulty, no evidence of aphasia.  He is currently Covid positive.  Lab work is unremarkable.  CT of the head unremarkable.  Will obtain MRI of the brain per neurology recommendations.  Care handed off to oncoming team.  Final Clinical Impressions(s) / ED Diagnoses   Final diagnoses:  Stroke-like symptoms    ED Discharge Orders    None     Portions of this note were generated with Dragon dictation software. Dictation errors may occur despite best attempts at proofreading.    Delia Heady, PA-C 04/19/19 1524    Wyvonnia Dusky, MD 04/19/19 2142

## 2019-04-19 NOTE — ED Triage Notes (Signed)
Pt to triage via [facility]> COVID + 1 week ago.  Around 4pm yesterday pt started feeling lightheaded.  States he was unable to move L fingers for about 5 min that resolved but continues to have L leg weakness.  No arm drift.

## 2019-04-20 ENCOUNTER — Inpatient Hospital Stay (HOSPITAL_COMMUNITY): Payer: No Typology Code available for payment source

## 2019-04-20 DIAGNOSIS — I6389 Other cerebral infarction: Secondary | ICD-10-CM

## 2019-04-20 DIAGNOSIS — I6300 Cerebral infarction due to thrombosis of unspecified precerebral artery: Secondary | ICD-10-CM

## 2019-04-20 LAB — COMPREHENSIVE METABOLIC PANEL
ALT: 81 U/L — ABNORMAL HIGH (ref 0–44)
AST: 55 U/L — ABNORMAL HIGH (ref 15–41)
Albumin: 2.5 g/dL — ABNORMAL LOW (ref 3.5–5.0)
Alkaline Phosphatase: 67 U/L (ref 38–126)
Anion gap: 12 (ref 5–15)
BUN: 30 mg/dL — ABNORMAL HIGH (ref 8–23)
CO2: 21 mmol/L — ABNORMAL LOW (ref 22–32)
Calcium: 8.5 mg/dL — ABNORMAL LOW (ref 8.9–10.3)
Chloride: 102 mmol/L (ref 98–111)
Creatinine, Ser: 1.1 mg/dL (ref 0.61–1.24)
GFR calc Af Amer: >60 mL/min (ref >60)
GFR calc non Af Amer: >60 mL/min (ref >60)
Glucose, Bld: 140 mg/dL — ABNORMAL HIGH (ref 70–99)
Potassium: 4.9 mmol/L (ref 3.5–5.1)
Sodium: 135 mmol/L (ref 135–145)
Total Bilirubin: 0.6 mg/dL (ref 0.3–1.2)
Total Protein: 6.8 g/dL (ref 6.5–8.1)

## 2019-04-20 LAB — LIPID PANEL
Cholesterol: 127 mg/dL (ref 0–200)
HDL: 23 mg/dL — ABNORMAL LOW (ref >40)
LDL Cholesterol: 83 mg/dL (ref 0–99)
Total CHOL/HDL Ratio: 5.5 RATIO
Triglycerides: 106 mg/dL (ref <150)
VLDL: 21 mg/dL (ref 0–40)

## 2019-04-20 LAB — FERRITIN: Ferritin: 893 ng/mL — ABNORMAL HIGH (ref 24–336)

## 2019-04-20 LAB — C-REACTIVE PROTEIN: CRP: 12.6 mg/dL — ABNORMAL HIGH (ref <1.0)

## 2019-04-20 LAB — ECHOCARDIOGRAM COMPLETE

## 2019-04-20 LAB — D-DIMER, QUANTITATIVE: D-Dimer, Quant: 1.45 ug/mL-FEU — ABNORMAL HIGH (ref 0.00–0.50)

## 2019-04-20 LAB — POC SARS CORONAVIRUS 2 AG -  ED: SARS Coronavirus 2 Ag: NEGATIVE

## 2019-04-20 MED ORDER — SODIUM CHLORIDE 0.9 % IV SOLN
INTRAVENOUS | Status: AC
  Administered 2019-04-20 (×2): via INTRAVENOUS

## 2019-04-20 MED ORDER — APIXABAN 5 MG PO TABS
5.0000 mg | ORAL_TABLET | Freq: Two times a day (BID) | ORAL | Status: DC
  Administered 2019-04-20 – 2019-04-23 (×7): 5 mg via ORAL
  Filled 2019-04-20 (×8): qty 1

## 2019-04-20 NOTE — Progress Notes (Signed)
Echocardiogram 2D Echocardiogram has been performed.  [name] 04/20/2019, 3:17 PM

## 2019-04-20 NOTE — ED Notes (Signed)
Pt moved to inpt stretcher for comfort while boarding in the ED, pt able to stand and sit in chair with minimal shob on 2 L 02. Pt did desat from 97 to 93 % upon return to stretcher.

## 2019-04-20 NOTE — Progress Notes (Signed)
PROGRESS NOTE    Raymond Camacho  [id number] DOB: July 13, 1940 DOA: 04/19/2019 PCP: Burnard Bunting, MD    Brief Narrative:  78 y.o. male with medical history significant of PVC's presenting with acute onset L sided weakness that started the afternoon on the day prior to admit. Pt recalled first noticing difficulty holding utensils with his left hand and later in the day progressing to LLE feeling "like Jell-O." Symptoms did not resolve and pt subsequently presented to ED for further work up.   Of note pt has a hx of feeling "like run over by a truck" and fever as high as 101F over the past week. Pt reports occasional cough worse when speaking. Pt was later seen at the [place] where he was reportedly tested positive for COVID on 11/27.  ED Course: In the ED, pt noted to have unremarkable head CT. Neurology was consulted by EDP with recommendations for MRI, currently pending. COVID testing in ED pending. CXR notable for bilateral patchy peripheral interstitial opacities compatible with COVID. Hospitalist consulted for consideration for admission.  Assessment & Plan:   Active Problems:   Stroke ([place])  1. Acute Stroke 1. Pt with presenting L sided weakness 2. Neurology consulted. Discussed with Stroke Team. Recommendation for 3 months of eliquis followed by aspirin afterwards 3. CT head personally reviewed, unremarakble 4. 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 PT/OT consulted 6. Follow up 2d echo 7. A1c of 6.6 8. LDL of 83 9. Suspect presenting stroke is related to hypercoagulable state from below COVID 2. COVID pneumonia 1. Pt confirmed to be pos for COVID from [facility] test from 11/24, results were faxed and personally reviewed by myself 2. CXR reviewed. Findings of B infiltrates. Pt coughing and with myalgias. O2 sat low of 89% 3. Pt is continued on dexamethasone 6mg  x total 10 days and remdesivir x 5 days 4. Remains afebrile 5. Follow serial  inflammatory markers. CRP up to 12.6 from 11.4 and ferritin up to 893 from 862 6. Continue on Vitamin C and zinc 3. Sinus tach 1. Seems stable at this time 2. Continue on tele 3. On flecanide 4. Pt well known to [place] [facility]ologist and has hx of ablation  DVT prophylaxis: eliquis Code Status: Full Family Communication: Pt in room, family not at bedside Disposition Plan: Uncertain at this time  Consultants:   Neurology/Stroke team  Procedures:     Antimicrobials: Anti-infectives (From admission, onward)   Start     Dose/Rate Route Frequency Ordered Stop   04/20/19 1000  remdesivir 100 mg in sodium chloride 0.9 % 100 mL IVPB     100 mg 200 mL/hr over 30 Minutes Intravenous Daily 04/19/19 1741 04/24/19 0959   04/19/19 1830  remdesivir 200 mg in sodium chloride 0.9% 250 mL IVPB     200 mg 580 mL/hr over 30 Minutes Intravenous Once 04/19/19 1741 04/19/19 2000       Subjective: Still coughing, otherwise without complaints today  Objective: Vitals:   04/20/19 0800 04/20/19 1000 04/20/19 1200 04/20/19 1300  BP: 132/83 125/78 132/75   Pulse: 80 93 84 78  Resp: 19 18 (!) 21   Temp:      TempSrc:      SpO2: 95% 95% 94% 95%    Intake/Output Summary (Last 24 hours) at 04/20/2019 1807 Last data filed at 04/20/2019 1007 Gross per 24 hour  Intake 350 ml  Output 350 ml  Net 0 ml   There were no vitals filed  for this visit.  Examination: General exam: Awake, laying in bed, in nad Respiratory system: Normal respiratory effort, no wheezing Cardiovascular system: regular rate, s1, s2 Gastrointestinal system: Soft, nondistended, positive BS Central nervous system: CN2-12 grossly intact, strength intact Extremities: Perfused, no clubbing Skin: Normal skin turgor, no notable skin lesions seen Psychiatry: Mood normal // no visual hallucinations   Data Reviewed: I have personally reviewed following labs and imaging studies  CBC: Recent Labs  Lab 04/19/19 1216 04/19/19 1917   WBC 9.5 8.9  NEUTROABS 6.5  --   HGB 13.7 13.5  HCT 40.3 39.7  MCV 89.0 89.8  PLT 358 [id number]*   Basic Metabolic Panel: Recent Labs  Lab 04/19/19 1216 04/19/19 1917 04/20/19 0400  NA 136  --  135  K 4.4  --  4.9  CL 102  --  102  CO2 22  --  21*  GLUCOSE 92  --  140*  BUN 31*  --  30*  CREATININE 1.19 1.32* 1.10  CALCIUM 8.8*  --  8.5*   GFR: CrCl cannot be calculated (Unknown ideal weight.). Liver Function Tests: Recent Labs  Lab 04/19/19 1216 04/20/19 0400  AST 52* 55*  ALT 74* 81*  ALKPHOS 63 67  BILITOT 0.8 0.6  PROT 6.6 6.8  ALBUMIN 2.5* 2.5*   No results for input(s): LIPASE, AMYLASE in the last 168 hours. No results for input(s): AMMONIA in the last 168 hours. Coagulation Profile: Recent Labs  Lab 04/19/19 1216  INR 1.2   Cardiac Enzymes: No results for input(s): CKTOTAL, CKMB, CKMBINDEX, TROPONINI in the last 168 hours. BNP (last 3 results) No results for input(s): PROBNP in the last 8760 hours. HbA1C: Recent Labs    04/19/19 1917  HGBA1C 6.6*   CBG: No results for input(s): GLUCAP in the last 168 hours. Lipid Profile: Recent Labs    04/20/19 1057  CHOL 127  HDL 23*  LDLCALC 83  TRIG 106  CHOLHDL 5.5   Thyroid Function Tests: No results for input(s): TSH, T4TOTAL, FREET4, T3FREE, THYROIDAB in the last 72 hours. Anemia Panel: Recent Labs    04/19/19 1917 04/20/19 0400  FERRITIN 862* 893*   Sepsis Labs: No results for input(s): PROCALCITON, LATICACIDVEN in the last 168 hours.  Recent Results (from the past 240 hour(s))  SARS CORONAVIRUS 2 (TAT 6-24 HRS) Nasopharyngeal Nasopharyngeal Swab     Status: None   Collection Time: 04/19/19  4:39 PM   Specimen: Nasopharyngeal Swab  Result Value Ref Range Status   SARS Coronavirus 2 NEGATIVE NEGATIVE Final    Comment: (NOTE) SARS-CoV-2 target nucleic acids are NOT DETECTED. The SARS-CoV-2 RNA is generally detectable in upper and lower respiratory specimens during the acute phase of  infection. Negative results do not preclude SARS-CoV-2 infection, do not rule out co-infections with other pathogens, and should not be used as the sole basis for treatment or other patient management decisions. Negative results must be combined with clinical observations, patient history, and epidemiological information. The expected result is Negative. Fact Sheet for Patients: SugarRoll.be Fact Sheet for Healthcare Providers: https://www.woods-mathews.com/ This test is not yet approved or cleared by the [place] FDA and  has been authorized for detection and/or diagnosis of SARS-CoV-2 by FDA under an Emergency Use Authorization (EUA). This EUA will remain  in effect (meaning this test can be used) for the duration of the COVID-19 declaration under Section 56 4(b)(1) of the Act, 21 U.S.C. section 360bbb-3(b)(1), unless the authorization is terminated or revoked sooner. Performed  at [facility], [place] [address], [place], [place] [address]      Radiology Studies: Ct Head Wo Contrast  Result Date: 04/19/2019 CLINICAL DATA:  Possible stroke, onset of lightheadedness yesterday at 1600 hours, diagnosed with COVID-19 1 week ago, former smoker EXAM: CT HEAD WITHOUT CONTRAST TECHNIQUE: Contiguous axial images were obtained from the base of the skull through the vertex without intravenous contrast. Sagittal and coronal MPR images reconstructed from axial data set. COMPARISON:  MR head 06/25/2006 FINDINGS: Brain: Normal ventricular morphology. No midline shift or mass effect. Normal appearance of brain parenchyma. No intracranial hemorrhage, mass lesion or evidence of acute infarction. No extra-axial fluid collections. Vascular: No hyperdense vessels Skull: Intact Sinuses/Orbits: Clear Other: N/A IMPRESSION: Normal exam. Electronically Signed   By: Lavonia Dana M.D.   On: 04/19/2019 15:19   Mr Angio Head Wo Contrast  Result Date:  04/19/2019 CLINICAL DATA:  Left hand weakness and ataxia. EXAM: MRI HEAD WITHOUT CONTRAST MRA HEAD WITHOUT CONTRAST MRA NECK WITHOUT CONTRAST TECHNIQUE: Multiplanar, multiecho pulse sequences of the brain and surrounding structures were obtained without intravenous contrast. Angiographic images of the Circle of Willis were obtained using MRA technique without intravenous contrast. Angiographic images of the neck were obtained using MRA technique without intravenous contrast. Carotid stenosis measurements (when applicable) are obtained utilizing NASCET criteria, using the distal internal carotid diameter as the denominator. The patient declined the administration of intravenous contrast agent. COMPARISON:  Brain MRI 06/25/2006 FINDINGS: MRI HEAD FINDINGS BRAIN: The midline structures are normal. There are scattered punctate foci of abnormal diffusion restriction within the right MCA territory, including along the precentral gyrus. There is no hemorrhage or mass effect. Minimal white matter hyperintensity, nonspecific and commonly seen in asymptomatic patients of this age. The CSF spaces are normal for age, with no hydrocephalus. Blood-sensitive sequences show no chronic microhemorrhage or superficial siderosis. SKULL AND UPPER CERVICAL SPINE: The visualized skull base, calvarium, upper cervical spine and extracranial soft tissues are normal. SINUSES/ORBITS: No fluid levels or advanced mucosal thickening. No mastoid or middle ear effusion. The orbits are normal. MRA HEAD FINDINGS POSTERIOR CIRCULATION: --Basilar artery: Normal. --Posterior cerebral arteries: Normal. Both originate from the basilar artery. --Superior cerebellar arteries: Normal. --Inferior cerebellar arteries: Normal anterior and posterior inferior cerebellar arteries. ANTERIOR CIRCULATION: --Intracranial internal carotid arteries: Normal. --Anterior cerebral arteries: Normal. Both A1 segments are present. Patent anterior communicating artery. --Middle  cerebral arteries: Normal. --Posterior communicating arteries: Diminutive or absent MRA NECK FINDINGS Aortic arch: Normal 3 vessel aortic branching pattern. The visualized subclavian arteries are normal. Right carotid system: Normal course and caliber without stenosis or evidence of dissection. Left carotid system: Normal course and caliber without stenosis or evidence of dissection. Vertebral arteries: Left dominant. Vertebral artery origins are normal on the left, inadequately visualized on the right. The right vertebral artery is diffusely diminutive. Vertebral arteries are normal in course and caliber to the vertebrobasilar confluence without stenosis or evidence of dissection. IMPRESSION: 1. Scattered small foci of acute ischemia within the right MCA territory, including along the precentral gyrus. No hemorrhage or mass effect. 2. Normal MRA of the head and neck. Electronically Signed   By: Ulyses Jarred M.D.   On: 04/19/2019 19:15   Mr Angio Neck Wo Contrast  Result Date: 04/19/2019 CLINICAL DATA:  Left hand weakness and ataxia. EXAM: MRI HEAD WITHOUT CONTRAST MRA HEAD WITHOUT CONTRAST MRA NECK WITHOUT CONTRAST TECHNIQUE: Multiplanar, multiecho pulse sequences of the brain and surrounding structures were obtained without intravenous contrast. Angiographic  images of the Circle of Willis were obtained using MRA technique without intravenous contrast. Angiographic images of the neck were obtained using MRA technique without intravenous contrast. Carotid stenosis measurements (when applicable) are obtained utilizing NASCET criteria, using the distal internal carotid diameter as the denominator. The patient declined the administration of intravenous contrast agent. COMPARISON:  Brain MRI 06/25/2006 FINDINGS: MRI HEAD FINDINGS BRAIN: The midline structures are normal. There are scattered punctate foci of abnormal diffusion restriction within the right MCA territory, including along the precentral gyrus. There is  no hemorrhage or mass effect. Minimal white matter hyperintensity, nonspecific and commonly seen in asymptomatic patients of this age. The CSF spaces are normal for age, with no hydrocephalus. Blood-sensitive sequences show no chronic microhemorrhage or superficial siderosis. SKULL AND UPPER CERVICAL SPINE: The visualized skull base, calvarium, upper cervical spine and extracranial soft tissues are normal. SINUSES/ORBITS: No fluid levels or advanced mucosal thickening. No mastoid or middle ear effusion. The orbits are normal. MRA HEAD FINDINGS POSTERIOR CIRCULATION: --Basilar artery: Normal. --Posterior cerebral arteries: Normal. Both originate from the basilar artery. --Superior cerebellar arteries: Normal. --Inferior cerebellar arteries: Normal anterior and posterior inferior cerebellar arteries. ANTERIOR CIRCULATION: --Intracranial internal carotid arteries: Normal. --Anterior cerebral arteries: Normal. Both A1 segments are present. Patent anterior communicating artery. --Middle cerebral arteries: Normal. --Posterior communicating arteries: Diminutive or absent MRA NECK FINDINGS Aortic arch: Normal 3 vessel aortic branching pattern. The visualized subclavian arteries are normal. Right carotid system: Normal course and caliber without stenosis or evidence of dissection. Left carotid system: Normal course and caliber without stenosis or evidence of dissection. Vertebral arteries: Left dominant. Vertebral artery origins are normal on the left, inadequately visualized on the right. The right vertebral artery is diffusely diminutive. Vertebral arteries are normal in course and caliber to the vertebrobasilar confluence without stenosis or evidence of dissection. IMPRESSION: 1. Scattered small foci of acute ischemia within the right MCA territory, including along the precentral gyrus. No hemorrhage or mass effect. 2. Normal MRA of the head and neck. Electronically Signed   By: Ulyses Jarred M.D.   On: 04/19/2019 19:15    Mr Brain Wo Contrast  Result Date: 04/19/2019 CLINICAL DATA:  Left hand weakness and ataxia. EXAM: MRI HEAD WITHOUT CONTRAST MRA HEAD WITHOUT CONTRAST MRA NECK WITHOUT CONTRAST TECHNIQUE: Multiplanar, multiecho pulse sequences of the brain and surrounding structures were obtained without intravenous contrast. Angiographic images of the Circle of Willis were obtained using MRA technique without intravenous contrast. Angiographic images of the neck were obtained using MRA technique without intravenous contrast. Carotid stenosis measurements (when applicable) are obtained utilizing NASCET criteria, using the distal internal carotid diameter as the denominator. The patient declined the administration of intravenous contrast agent. COMPARISON:  Brain MRI 06/25/2006 FINDINGS: MRI HEAD FINDINGS BRAIN: The midline structures are normal. There are scattered punctate foci of abnormal diffusion restriction within the right MCA territory, including along the precentral gyrus. There is no hemorrhage or mass effect. Minimal white matter hyperintensity, nonspecific and commonly seen in asymptomatic patients of this age. The CSF spaces are normal for age, with no hydrocephalus. Blood-sensitive sequences show no chronic microhemorrhage or superficial siderosis. SKULL AND UPPER CERVICAL SPINE: The visualized skull base, calvarium, upper cervical spine and extracranial soft tissues are normal. SINUSES/ORBITS: No fluid levels or advanced mucosal thickening. No mastoid or middle ear effusion. The orbits are normal. MRA HEAD FINDINGS POSTERIOR CIRCULATION: --Basilar artery: Normal. --Posterior cerebral arteries: Normal. Both originate from the basilar artery. --Superior cerebellar arteries: Normal. --Inferior cerebellar arteries: Normal  anterior and posterior inferior cerebellar arteries. ANTERIOR CIRCULATION: --Intracranial internal carotid arteries: Normal. --Anterior cerebral arteries: Normal. Both A1 segments are present.  Patent anterior communicating artery. --Middle cerebral arteries: Normal. --Posterior communicating arteries: Diminutive or absent MRA NECK FINDINGS Aortic arch: Normal 3 vessel aortic branching pattern. The visualized subclavian arteries are normal. Right carotid system: Normal course and caliber without stenosis or evidence of dissection. Left carotid system: Normal course and caliber without stenosis or evidence of dissection. Vertebral arteries: Left dominant. Vertebral artery origins are normal on the left, inadequately visualized on the right. The right vertebral artery is diffusely diminutive. Vertebral arteries are normal in course and caliber to the vertebrobasilar confluence without stenosis or evidence of dissection. IMPRESSION: 1. Scattered small foci of acute ischemia within the right MCA territory, including along the precentral gyrus. No hemorrhage or mass effect. 2. Normal MRA of the head and neck. Electronically Signed   By: Ulyses Jarred M.D.   On: 04/19/2019 19:15   Dg Chest Portable 1 View  Result Date: 04/19/2019 CLINICAL DATA:  Shortness of breath EXAM: PORTABLE CHEST 1 VIEW COMPARISON:  None. FINDINGS: The heart size and mediastinal contours are within normal limits accounting for technique. Moderate patchy bilateral peripheral predominant interstitial and airspace opacities are noted. There is no pleural effusion or pneumothorax. The visualized skeletal structures are unremarkable. IMPRESSION: 1. Moderate patchy bilateral peripheral predominant interstitial and airspace opacities compatible with COVID-19 pneumonia. Electronically Signed   By: Zerita Boers M.D.   On: 04/19/2019 17:25    Scheduled Meds:  albuterol  2 puff Inhalation Q6H   apixaban  5 mg Oral BID   atorvastatin  80 mg Oral q1800   dexamethasone  6 mg Oral Q24H   sodium chloride flush  3 mL Intravenous Once   vitamin C  1,000 mg Oral Daily   zinc sulfate  220 mg Oral Daily   Continuous Infusions:  sodium  chloride 50 mL/hr at 04/20/19 1413   remdesivir 100 mg in NS 100 mL Stopped (04/20/19 1007)     LOS: 1 day   Marylu Lund, MD [facility] Pager On Amion  If 7PM-7AM, please contact night-coverage 04/20/2019, 6:07 PM

## 2019-04-20 NOTE — Progress Notes (Signed)
[place] for apixaban Indication: stroke  Labs: Recent Labs    04/19/19 1216 04/19/19 1917 04/20/19 0400  HGB 13.7 13.5  --   HCT 40.3 39.7  --   PLT 358 435*  --   APTT 36  --   --   LABPROT 15.1  --   --   INR 1.2  --   --   CREATININE 1.19 1.32* 1.10    Assessment: 34 yom with recent diagnosis of COVID-19 04/10/19 presenting with acute stroke. Pharmacy consulted to dose apixaban - planning to treat x 3 months per Neuro. Patient is not on anticoagulation PTA. Lovenox for VTE prophylaxis currently ordered - last dose 12/6 PM. CBC stable, SCr 1.1. No active bleed issues documented.  Goal of Therapy:  Stroke prevention Monitor platelets by anticoagulation protocol: Yes   Plan:  D/c Lovenox Start apixaban 5mg  PO BID Monitor CBC, s/sx bleeding  Elicia Lamp, PharmD, BCPS Clinical Pharmacist 04/20/2019 1:19 PM

## 2019-04-20 NOTE — Progress Notes (Signed)
Pt to be seen later. Was notified by RN that copy confirming COVID pos was received from [place] this AM. Discussed with ID. Continue isolation and continue tx for COVID PNA as per H and P.

## 2019-04-20 NOTE — Discharge Instructions (Addendum)
Raymond Camacho,  You are in the hospital with COVID-19 infection.  He received remdesivir in addition to steroids.  He will go home with continued course of steroids.  He will need to quarantine and isolate yourself until December 15 at the earliest.  If you have worsening symptoms of dyspnea, chest pain, fevers consider returning for reevaluation.  You are also in the hospital with a stroke.  Thankfully her symptoms have improved.  You will be discharged on Eliquis which is a blood thinner.  You also go home on Lipitor, although I understand you may not take this secondary to previous intolerance.  You will need follow-up with your primary care physician and the neurologist.  You will be given Eliquis for 30 days on discharge but will need to be on Eliquis for the next 3 months.  Your primary care physician or neurologist should be following up on Eliquis refills.  Per the stroke doctor, you will need to resume your aspirin in 3 months after completing your Eliquis.   Information on my medicine - ELIQUIS (apixaban)  This medication education was reviewed with me or my healthcare representative as part of my discharge preparation.  Why was Eliquis prescribed for you? Eliquis was prescribed for you to reduce the risk of a blood clot forming that can cause a stroke.  What do You need to know about Eliquis ? Take your Eliquis TWICE DAILY - one tablet in the morning and one tablet in the evening with or without food. If you have difficulty swallowing the tablet whole please discuss with your pharmacist how to take the medication safely.  Take Eliquis exactly as prescribed by your doctor and DO NOT stop taking Eliquis without talking to the doctor who prescribed the medication.  Stopping may increase your risk of developing a stroke.  Refill your prescription before you run out.  After discharge, you should have regular check-up appointments with your healthcare provider that is prescribing your  Eliquis.  In the future your dose may need to be changed if your kidney function or weight changes by a significant amount or as you get older.  What do you do if you miss a dose? If you miss a dose, take it as soon as you remember on the same day and resume taking twice daily.  Do not take more than one dose of ELIQUIS at the same time to make up a missed dose.  Important Safety Information A possible side effect of Eliquis is bleeding. You should call your healthcare provider right away if you experience any of the following: ? Bleeding from an injury or your nose that does not stop. ? Unusual colored urine (red or dark brown) or unusual colored stools (red or black). ? Unusual bruising for unknown reasons. ? A serious fall or if you hit your head (even if there is no bleeding).  Some medicines may interact with Eliquis and might increase your risk of bleeding or clotting while on Eliquis. To help avoid this, consult your healthcare provider or pharmacist prior to using any new prescription or non-prescription medications, including herbals, vitamins, non-steroidal anti-inflammatory drugs (NSAIDs) and supplements.  This website has more information on Eliquis (apixaban): http://www.eliquis.com/eliquis/home

## 2019-04-20 NOTE — Progress Notes (Signed)
STROKE TEAM PROGRESS NOTE   INTERVAL HISTORY I have personally reviewed history of presenting illness with the patient, electronic medical records and imaging films in PACS.  He has no known prior history of A. fib but does have a history of palpitations and has undergone electrical ablation at [facility] a few years ago.  He is on flecainide but not on anticoagulation.  He was diagnosed with Covid last week and had mild upper respiratory tract symptoms and stayed home.  He presented with 15-minute episode of left leg weakness and left hand paresthesias which appear to be improving.  MRI scan shows embolic-looking right frontal MCA branch infarct and MRA of the brain and neck did not show large vessel stenosis or occlusion.  His chest x-ray shows changes of multifocal pneumonia compatible with Covid.  His D-dimer is only mildly elevated.  Vitals:   04/20/19 0415 04/20/19 0554 04/20/19 0600 04/20/19 0800  BP:   115/87 132/83  Pulse: 79 73 70 80  Resp: 19 13 (!) 9 19  Temp:      TempSrc:      SpO2: 94% 97% 95% 95%    CBC:  Recent Labs  Lab 04/19/19 1216 04/19/19 1917  WBC 9.5 8.9  NEUTROABS 6.5  --   HGB 13.7 13.5  HCT 40.3 39.7  MCV 89.0 89.8  PLT 358 435*    Basic Metabolic Panel:  Recent Labs  Lab 04/19/19 1216 04/19/19 1917 04/20/19 0400  NA 136  --  135  K 4.4  --  4.9  CL 102  --  102  CO2 22  --  21*  GLUCOSE 92  --  140*  BUN 31*  --  30*  CREATININE 1.19 1.32* 1.10  CALCIUM 8.8*  --  8.5*   Lipid Panel: No results found for: CHOL, TRIG, HDL, CHOLHDL, VLDL, LDLCALC HgbA1c:  Lab Results  Component Value Date   HGBA1C 6.6 (H) 04/19/2019   Urine Drug Screen: No results found for: LABOPIA, COCAINSCRNUR, LABBENZ, AMPHETMU, THCU, LABBARB  Alcohol Level No results found for: ETH  IMAGING Ct Head Wo Contrast  Result Date: 04/19/2019 CLINICAL DATA:  Possible stroke, onset of lightheadedness yesterday at 1600 hours, diagnosed with COVID-19 1 week ago, former smoker EXAM:  CT HEAD WITHOUT CONTRAST TECHNIQUE: Contiguous axial images were obtained from the base of the skull through the vertex without intravenous contrast. Sagittal and coronal MPR images reconstructed from axial data set. COMPARISON:  MR head 06/25/2006 FINDINGS: Brain: Normal ventricular morphology. No midline shift or mass effect. Normal appearance of brain parenchyma. No intracranial hemorrhage, mass lesion or evidence of acute infarction. No extra-axial fluid collections. Vascular: No hyperdense vessels Skull: Intact Sinuses/Orbits: Clear Other: N/A IMPRESSION: Normal exam. Electronically Signed   By: Lavonia Dana M.D.   On: 04/19/2019 15:19   Mr Angio Head Wo Contrast  Result Date: 04/19/2019 CLINICAL DATA:  Left hand weakness and ataxia. EXAM: MRI HEAD WITHOUT CONTRAST MRA HEAD WITHOUT CONTRAST MRA NECK WITHOUT CONTRAST TECHNIQUE: Multiplanar, multiecho pulse sequences of the brain and surrounding structures were obtained without intravenous contrast. Angiographic images of the Circle of Willis were obtained using MRA technique without intravenous contrast. Angiographic images of the neck were obtained using MRA technique without intravenous contrast. Carotid stenosis measurements (when applicable) are obtained utilizing NASCET criteria, using the distal internal carotid diameter as the denominator. The patient declined the administration of intravenous contrast agent. COMPARISON:  Brain MRI 06/25/2006 FINDINGS: MRI HEAD FINDINGS BRAIN: The midline structures are normal.  There are scattered punctate foci of abnormal diffusion restriction within the right MCA territory, including along the precentral gyrus. There is no hemorrhage or mass effect. Minimal white matter hyperintensity, nonspecific and commonly seen in asymptomatic patients of this age. The CSF spaces are normal for age, with no hydrocephalus. Blood-sensitive sequences show no chronic microhemorrhage or superficial siderosis. SKULL AND UPPER CERVICAL  SPINE: The visualized skull base, calvarium, upper cervical spine and extracranial soft tissues are normal. SINUSES/ORBITS: No fluid levels or advanced mucosal thickening. No mastoid or middle ear effusion. The orbits are normal. MRA HEAD FINDINGS POSTERIOR CIRCULATION: --Basilar artery: Normal. --Posterior cerebral arteries: Normal. Both originate from the basilar artery. --Superior cerebellar arteries: Normal. --Inferior cerebellar arteries: Normal anterior and posterior inferior cerebellar arteries. ANTERIOR CIRCULATION: --Intracranial internal carotid arteries: Normal. --Anterior cerebral arteries: Normal. Both A1 segments are present. Patent anterior communicating artery. --Middle cerebral arteries: Normal. --Posterior communicating arteries: Diminutive or absent MRA NECK FINDINGS Aortic arch: Normal 3 vessel aortic branching pattern. The visualized subclavian arteries are normal. Right carotid system: Normal course and caliber without stenosis or evidence of dissection. Left carotid system: Normal course and caliber without stenosis or evidence of dissection. Vertebral arteries: Left dominant. Vertebral artery origins are normal on the left, inadequately visualized on the right. The right vertebral artery is diffusely diminutive. Vertebral arteries are normal in course and caliber to the vertebrobasilar confluence without stenosis or evidence of dissection. IMPRESSION: 1. Scattered small foci of acute ischemia within the right MCA territory, including along the precentral gyrus. No hemorrhage or mass effect. 2. Normal MRA of the head and neck. Electronically Signed   By: Ulyses Jarred M.D.   On: 04/19/2019 19:15   Mr Angio Neck Wo Contrast  Result Date: 04/19/2019 CLINICAL DATA:  Left hand weakness and ataxia. EXAM: MRI HEAD WITHOUT CONTRAST MRA HEAD WITHOUT CONTRAST MRA NECK WITHOUT CONTRAST TECHNIQUE: Multiplanar, multiecho pulse sequences of the brain and surrounding structures were obtained without  intravenous contrast. Angiographic images of the Circle of Willis were obtained using MRA technique without intravenous contrast. Angiographic images of the neck were obtained using MRA technique without intravenous contrast. Carotid stenosis measurements (when applicable) are obtained utilizing NASCET criteria, using the distal internal carotid diameter as the denominator. The patient declined the administration of intravenous contrast agent. COMPARISON:  Brain MRI 06/25/2006 FINDINGS: MRI HEAD FINDINGS BRAIN: The midline structures are normal. There are scattered punctate foci of abnormal diffusion restriction within the right MCA territory, including along the precentral gyrus. There is no hemorrhage or mass effect. Minimal white matter hyperintensity, nonspecific and commonly seen in asymptomatic patients of this age. The CSF spaces are normal for age, with no hydrocephalus. Blood-sensitive sequences show no chronic microhemorrhage or superficial siderosis. SKULL AND UPPER CERVICAL SPINE: The visualized skull base, calvarium, upper cervical spine and extracranial soft tissues are normal. SINUSES/ORBITS: No fluid levels or advanced mucosal thickening. No mastoid or middle ear effusion. The orbits are normal. MRA HEAD FINDINGS POSTERIOR CIRCULATION: --Basilar artery: Normal. --Posterior cerebral arteries: Normal. Both originate from the basilar artery. --Superior cerebellar arteries: Normal. --Inferior cerebellar arteries: Normal anterior and posterior inferior cerebellar arteries. ANTERIOR CIRCULATION: --Intracranial internal carotid arteries: Normal. --Anterior cerebral arteries: Normal. Both A1 segments are present. Patent anterior communicating artery. --Middle cerebral arteries: Normal. --Posterior communicating arteries: Diminutive or absent MRA NECK FINDINGS Aortic arch: Normal 3 vessel aortic branching pattern. The visualized subclavian arteries are normal. Right carotid system: Normal course and caliber  without stenosis or evidence of dissection. Left carotid  system: Normal course and caliber without stenosis or evidence of dissection. Vertebral arteries: Left dominant. Vertebral artery origins are normal on the left, inadequately visualized on the right. The right vertebral artery is diffusely diminutive. Vertebral arteries are normal in course and caliber to the vertebrobasilar confluence without stenosis or evidence of dissection. IMPRESSION: 1. Scattered small foci of acute ischemia within the right MCA territory, including along the precentral gyrus. No hemorrhage or mass effect. 2. Normal MRA of the head and neck. Electronically Signed   By: Ulyses Jarred M.D.   On: 04/19/2019 19:15   Mr Brain Wo Contrast  Result Date: 04/19/2019 CLINICAL DATA:  Left hand weakness and ataxia. EXAM: MRI HEAD WITHOUT CONTRAST MRA HEAD WITHOUT CONTRAST MRA NECK WITHOUT CONTRAST TECHNIQUE: Multiplanar, multiecho pulse sequences of the brain and surrounding structures were obtained without intravenous contrast. Angiographic images of the Circle of Willis were obtained using MRA technique without intravenous contrast. Angiographic images of the neck were obtained using MRA technique without intravenous contrast. Carotid stenosis measurements (when applicable) are obtained utilizing NASCET criteria, using the distal internal carotid diameter as the denominator. The patient declined the administration of intravenous contrast agent. COMPARISON:  Brain MRI 06/25/2006 FINDINGS: MRI HEAD FINDINGS BRAIN: The midline structures are normal. There are scattered punctate foci of abnormal diffusion restriction within the right MCA territory, including along the precentral gyrus. There is no hemorrhage or mass effect. Minimal white matter hyperintensity, nonspecific and commonly seen in asymptomatic patients of this age. The CSF spaces are normal for age, with no hydrocephalus. Blood-sensitive sequences show no chronic microhemorrhage or  superficial siderosis. SKULL AND UPPER CERVICAL SPINE: The visualized skull base, calvarium, upper cervical spine and extracranial soft tissues are normal. SINUSES/ORBITS: No fluid levels or advanced mucosal thickening. No mastoid or middle ear effusion. The orbits are normal. MRA HEAD FINDINGS POSTERIOR CIRCULATION: --Basilar artery: Normal. --Posterior cerebral arteries: Normal. Both originate from the basilar artery. --Superior cerebellar arteries: Normal. --Inferior cerebellar arteries: Normal anterior and posterior inferior cerebellar arteries. ANTERIOR CIRCULATION: --Intracranial internal carotid arteries: Normal. --Anterior cerebral arteries: Normal. Both A1 segments are present. Patent anterior communicating artery. --Middle cerebral arteries: Normal. --Posterior communicating arteries: Diminutive or absent MRA NECK FINDINGS Aortic arch: Normal 3 vessel aortic branching pattern. The visualized subclavian arteries are normal. Right carotid system: Normal course and caliber without stenosis or evidence of dissection. Left carotid system: Normal course and caliber without stenosis or evidence of dissection. Vertebral arteries: Left dominant. Vertebral artery origins are normal on the left, inadequately visualized on the right. The right vertebral artery is diffusely diminutive. Vertebral arteries are normal in course and caliber to the vertebrobasilar confluence without stenosis or evidence of dissection. IMPRESSION: 1. Scattered small foci of acute ischemia within the right MCA territory, including along the precentral gyrus. No hemorrhage or mass effect. 2. Normal MRA of the head and neck. Electronically Signed   By: Ulyses Jarred M.D.   On: 04/19/2019 19:15   Dg Chest Portable 1 View  Result Date: 04/19/2019 CLINICAL DATA:  Shortness of breath EXAM: PORTABLE CHEST 1 VIEW COMPARISON:  None. FINDINGS: The heart size and mediastinal contours are within normal limits accounting for technique. Moderate patchy  bilateral peripheral predominant interstitial and airspace opacities are noted. There is no pleural effusion or pneumothorax. The visualized skeletal structures are unremarkable. IMPRESSION: 1. Moderate patchy bilateral peripheral predominant interstitial and airspace opacities compatible with COVID-19 pneumonia. Electronically Signed   By: Zerita Boers M.D.   On: 04/19/2019 17:25  PHYSICAL EXAM Pleasant elderly Caucasian male sitting up in bedside chair.  Does not appear to be in significant distress.  He has mild intermittent cough but is not short of breath. Afebrile. Head is nontraumatic. Neck is supple without bruit.    Cardiac exam no murmur or gallop. Lungs are clear to auscultation. Distal pulses are well felt. Neurological Exam ;  Awake  Alert oriented x 3. Normal speech and language.eye movements full without nystagmus.fundi were not visualized. Vision acuity and fields appear normal. Hearing is normal. Palatal movements are normal. Face symmetric. Tongue midline. Normal strength, tone, reflexes and coordination except mild subjective weakness of left hip flexors and ankle dorsiflexors only.. Normal sensation except mild paresthesias in the left hand.. Gait deferred.  ASSESSMENT/PLAN Mr. MAXMILLIAN HOCHSTEIN is a 78 y.o. male with history of PVCs w/ recent dx COVID 04/10/2019 when tested d/t respiratory symptoms not requiring hospitalization who developed transient L hand tinging and L sided weakness > 24h ago.  Stroke:   Scattered M MCA territory infarcts in setting of recent COVID as well as hx of palpations s/p ablation  CT head normal  MRI  Scattered R MCA territory infarcts   MRA head & neck normal  2D Echo pending  LDL ordered  HgbA1c 6.6  Lovenox 40 mg sq daily for VTE prophylaxis  aspirin 81 mg daily prior to admission, now on aspirin 325 mg daily. Given possibility of post COVID hypercoagulable state, will treat with Eliquis x 3 months. Will ask pharmacy to dose. Dr.  Leonie Man discussed with Dr Wyline Copas   Therapy recommendations:  pending   Disposition:  pending   COVID-19 PNA  Tested positive 04/10/2019 following some resrpiatory sx  Not hospitalized at time of dx  POC COVID test neg at [facility] and treated as positive per Dr. 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   Snores-at risk for sleep apnoea  Other Active Problems  Covid positive status  Hospital day # 1  He presented with embolic right MCA branch infarct in the setting of being Covid positive and his stroke may be related to Covid related hypercoagulability though alternative etiologies like cardiac source of embolism or paroxysmal A. fib are also possible.  Would normally recommend dual antiplatelet therapy for 3 weeks followed by aspirin alone but in case his Covid status merits short-term anticoagulation it would be okay to do so and switch to aspirin after that.  Check echocardiogram results and continue cardiac monitoring.  Aggressive risk factor modification.  Long discussion with patient as well as with Dr. Wyline Copas and answered questions.  Greater than 50% time during this 35-minute visit was spent on counseling and coordination of care about his embolic stroke CODE STATUS and hypercoagulability discussion and answering questions Antony Contras, MD Medical Director [place] Pager: [phone] 04/20/2019 2:37 PM  To contact Stroke Continuity provider, please refer to [email or web address] After hours, contact General Neurology

## 2019-04-20 NOTE — ED Notes (Signed)
Family at bedside. 

## 2019-04-20 NOTE — ED Notes (Signed)
C+/Tele  Ordered hospital ordered

## 2019-04-20 NOTE — ED Notes (Signed)
Tele   Breakfast ordered  

## 2019-04-21 DIAGNOSIS — U071 COVID-19: Secondary | ICD-10-CM

## 2019-04-21 DIAGNOSIS — J9691 Respiratory failure, unspecified with hypoxia: Secondary | ICD-10-CM

## 2019-04-21 DIAGNOSIS — J1282 Pneumonia due to coronavirus disease 2019: Secondary | ICD-10-CM

## 2019-04-21 LAB — COMPREHENSIVE METABOLIC PANEL
ALT: 78 U/L — ABNORMAL HIGH (ref 0–44)
AST: 37 U/L (ref 15–41)
Albumin: 2.7 g/dL — ABNORMAL LOW (ref 3.5–5.0)
Alkaline Phosphatase: 58 U/L (ref 38–126)
Anion gap: 9 (ref 5–15)
BUN: 36 mg/dL — ABNORMAL HIGH (ref 8–23)
CO2: 23 mmol/L (ref 22–32)
Calcium: 8.8 mg/dL — ABNORMAL LOW (ref 8.9–10.3)
Chloride: 104 mmol/L (ref 98–111)
Creatinine, Ser: 1.29 mg/dL — ABNORMAL HIGH (ref 0.61–1.24)
GFR calc Af Amer: >60 mL/min (ref >60)
GFR calc non Af Amer: 52 mL/min — ABNORMAL LOW (ref >60)
Glucose, Bld: 158 mg/dL — ABNORMAL HIGH (ref 70–99)
Potassium: 4.9 mmol/L (ref 3.5–5.1)
Sodium: 136 mmol/L (ref 135–145)
Total Bilirubin: 0.4 mg/dL (ref 0.3–1.2)
Total Protein: 6.8 g/dL (ref 6.5–8.1)

## 2019-04-21 LAB — C-REACTIVE PROTEIN: CRP: 7.2 mg/dL — ABNORMAL HIGH (ref <1.0)

## 2019-04-21 LAB — FERRITIN: Ferritin: 906 ng/mL — ABNORMAL HIGH (ref 24–336)

## 2019-04-21 MED ORDER — ATORVASTATIN CALCIUM 40 MG PO TABS
40.0000 mg | ORAL_TABLET | Freq: Every day | ORAL | Status: DC
  Administered 2019-04-21 – 2019-04-22 (×2): 40 mg via ORAL
  Filled 2019-04-21 (×2): qty 1

## 2019-04-21 NOTE — Progress Notes (Signed)
SLP Cancellation Note  Patient Details Name: Raymond Camacho MRN: [id number] DOB: 1941-04-30   Cancelled treatment:       Reason Eval/Treat Not Completed: SLP screened, no needs identified, will sign off   Abisai Coble, Katherene Ponto 04/21/2019, 10:38 AM

## 2019-04-21 NOTE — Progress Notes (Signed)
STROKE TEAM PROGRESS NOTE   INTERVAL HISTORY Patient is sitting in bedside chair.  No significant neurological changes.  He has been started on dexamethasone and remdesivir.  Vitals:   04/21/19 0100 04/21/19 0534 04/21/19 0833 04/21/19 1249  BP: 119/64 (!) 145/93 (!) 145/82 138/86  Pulse: 78  86 79  Resp: (!) 25  16 19   Temp: 98.7 F (37.1 C) 98.3 F (36.8 C) 98.5 F (36.9 C) 98.2 F (36.8 C)  TempSrc: Oral Oral Oral Oral  SpO2: 95%  96% 93%  Weight:        CBC:  Recent Labs  Lab 04/19/19 1216 04/19/19 1917  WBC 9.5 8.9  NEUTROABS 6.5  --   HGB 13.7 13.5  HCT 40.3 39.7  MCV 89.0 89.8  PLT 358 435*    Basic Metabolic Panel:  Recent Labs  Lab 04/20/19 0400 04/21/19 0540  NA 135 136  K 4.9 4.9  CL 102 104  CO2 21* 23  GLUCOSE 140* 158*  BUN 30* 36*  CREATININE 1.10 1.29*  CALCIUM 8.5* 8.8*   Lipid Panel:     Component Value Date/Time   CHOL 127 04/20/2019 1057   TRIG 106 04/20/2019 1057   HDL 23 (L) 04/20/2019 1057   CHOLHDL 5.5 04/20/2019 1057   VLDL 21 04/20/2019 1057   LDLCALC 83 04/20/2019 1057   HgbA1c:  Lab Results  Component Value Date   HGBA1C 6.6 (H) 04/19/2019   Urine Drug Screen: No results found for: LABOPIA, COCAINSCRNUR, LABBENZ, AMPHETMU, THCU, LABBARB  Alcohol Level No results found for: [facility]  IMAGING Mr Angio Head Wo Contrast  Result Date: 04/19/2019 CLINICAL DATA:  Left hand weakness and ataxia. EXAM: MRI HEAD WITHOUT CONTRAST MRA HEAD WITHOUT CONTRAST MRA NECK WITHOUT CONTRAST TECHNIQUE: Multiplanar, multiecho pulse sequences of the brain and surrounding structures were obtained without intravenous contrast. Angiographic images of the Circle of Willis were obtained using MRA technique without intravenous contrast. Angiographic images of the neck were obtained using MRA technique without intravenous contrast. Carotid stenosis measurements (when applicable) are obtained utilizing NASCET criteria, using the distal internal carotid  diameter as the denominator. The patient declined the administration of intravenous contrast agent. COMPARISON:  Brain MRI 06/25/2006 FINDINGS: MRI HEAD FINDINGS BRAIN: The midline structures are normal. There are scattered punctate foci of abnormal diffusion restriction within the right MCA territory, including along the precentral gyrus. There is no hemorrhage or mass effect. Minimal white matter hyperintensity, nonspecific and commonly seen in asymptomatic patients of this age. The CSF spaces are normal for age, with no hydrocephalus. Blood-sensitive sequences show no chronic microhemorrhage or superficial siderosis. SKULL AND UPPER CERVICAL SPINE: The visualized skull base, calvarium, upper cervical spine and extracranial soft tissues are normal. SINUSES/ORBITS: No fluid levels or advanced mucosal thickening. No mastoid or middle ear effusion. The orbits are normal. MRA HEAD FINDINGS POSTERIOR CIRCULATION: --Basilar artery: Normal. --Posterior cerebral arteries: Normal. Both originate from the basilar artery. --Superior cerebellar arteries: Normal. --Inferior cerebellar arteries: Normal anterior and posterior inferior cerebellar arteries. ANTERIOR CIRCULATION: --Intracranial internal carotid arteries: Normal. --Anterior cerebral arteries: Normal. Both A1 segments are present. Patent anterior communicating artery. --Middle cerebral arteries: Normal. --Posterior communicating arteries: Diminutive or absent MRA NECK FINDINGS Aortic arch: Normal 3 vessel aortic branching pattern. The visualized subclavian arteries are normal. Right carotid system: Normal course and caliber without stenosis or evidence of dissection. Left carotid system: Normal course and caliber without stenosis or evidence of dissection. Vertebral arteries: Left dominant. Vertebral artery origins are normal  on the left, inadequately visualized on the right. The right vertebral artery is diffusely diminutive. Vertebral arteries are normal in course  and caliber to the vertebrobasilar confluence without stenosis or evidence of dissection. IMPRESSION: 1. Scattered small foci of acute ischemia within the right MCA territory, including along the precentral gyrus. No hemorrhage or mass effect. 2. Normal MRA of the head and neck. Electronically Signed   By: Ulyses Jarred M.D.   On: 04/19/2019 19:15   Mr Angio Neck Wo Contrast  Result Date: 04/19/2019 CLINICAL DATA:  Left hand weakness and ataxia. EXAM: MRI HEAD WITHOUT CONTRAST MRA HEAD WITHOUT CONTRAST MRA NECK WITHOUT CONTRAST TECHNIQUE: Multiplanar, multiecho pulse sequences of the brain and surrounding structures were obtained without intravenous contrast. Angiographic images of the Circle of Willis were obtained using MRA technique without intravenous contrast. Angiographic images of the neck were obtained using MRA technique without intravenous contrast. Carotid stenosis measurements (when applicable) are obtained utilizing NASCET criteria, using the distal internal carotid diameter as the denominator. The patient declined the administration of intravenous contrast agent. COMPARISON:  Brain MRI 06/25/2006 FINDINGS: MRI HEAD FINDINGS BRAIN: The midline structures are normal. There are scattered punctate foci of abnormal diffusion restriction within the right MCA territory, including along the precentral gyrus. There is no hemorrhage or mass effect. Minimal white matter hyperintensity, nonspecific and commonly seen in asymptomatic patients of this age. The CSF spaces are normal for age, with no hydrocephalus. Blood-sensitive sequences show no chronic microhemorrhage or superficial siderosis. SKULL AND UPPER CERVICAL SPINE: The visualized skull base, calvarium, upper cervical spine and extracranial soft tissues are normal. SINUSES/ORBITS: No fluid levels or advanced mucosal thickening. No mastoid or middle ear effusion. The orbits are normal. MRA HEAD FINDINGS POSTERIOR CIRCULATION: --Basilar artery: Normal.  --Posterior cerebral arteries: Normal. Both originate from the basilar artery. --Superior cerebellar arteries: Normal. --Inferior cerebellar arteries: Normal anterior and posterior inferior cerebellar arteries. ANTERIOR CIRCULATION: --Intracranial internal carotid arteries: Normal. --Anterior cerebral arteries: Normal. Both A1 segments are present. Patent anterior communicating artery. --Middle cerebral arteries: Normal. --Posterior communicating arteries: Diminutive or absent MRA NECK FINDINGS Aortic arch: Normal 3 vessel aortic branching pattern. The visualized subclavian arteries are normal. Right carotid system: Normal course and caliber without stenosis or evidence of dissection. Left carotid system: Normal course and caliber without stenosis or evidence of dissection. Vertebral arteries: Left dominant. Vertebral artery origins are normal on the left, inadequately visualized on the right. The right vertebral artery is diffusely diminutive. Vertebral arteries are normal in course and caliber to the vertebrobasilar confluence without stenosis or evidence of dissection. IMPRESSION: 1. Scattered small foci of acute ischemia within the right MCA territory, including along the precentral gyrus. No hemorrhage or mass effect. 2. Normal MRA of the head and neck. Electronically Signed   By: Ulyses Jarred M.D.   On: 04/19/2019 19:15   Mr Brain Wo Contrast  Result Date: 04/19/2019 CLINICAL DATA:  Left hand weakness and ataxia. EXAM: MRI HEAD WITHOUT CONTRAST MRA HEAD WITHOUT CONTRAST MRA NECK WITHOUT CONTRAST TECHNIQUE: Multiplanar, multiecho pulse sequences of the brain and surrounding structures were obtained without intravenous contrast. Angiographic images of the Circle of Willis were obtained using MRA technique without intravenous contrast. Angiographic images of the neck were obtained using MRA technique without intravenous contrast. Carotid stenosis measurements (when applicable) are obtained utilizing NASCET  criteria, using the distal internal carotid diameter as the denominator. The patient declined the administration of intravenous contrast agent. COMPARISON:  Brain MRI 06/25/2006 FINDINGS: MRI HEAD FINDINGS  BRAIN: The midline structures are normal. There are scattered punctate foci of abnormal diffusion restriction within the right MCA territory, including along the precentral gyrus. There is no hemorrhage or mass effect. Minimal white matter hyperintensity, nonspecific and commonly seen in asymptomatic patients of this age. The CSF spaces are normal for age, with no hydrocephalus. Blood-sensitive sequences show no chronic microhemorrhage or superficial siderosis. SKULL AND UPPER CERVICAL SPINE: The visualized skull base, calvarium, upper cervical spine and extracranial soft tissues are normal. SINUSES/ORBITS: No fluid levels or advanced mucosal thickening. No mastoid or middle ear effusion. The orbits are normal. MRA HEAD FINDINGS POSTERIOR CIRCULATION: --Basilar artery: Normal. --Posterior cerebral arteries: Normal. Both originate from the basilar artery. --Superior cerebellar arteries: Normal. --Inferior cerebellar arteries: Normal anterior and posterior inferior cerebellar arteries. ANTERIOR CIRCULATION: --Intracranial internal carotid arteries: Normal. --Anterior cerebral arteries: Normal. Both A1 segments are present. Patent anterior communicating artery. --Middle cerebral arteries: Normal. --Posterior communicating arteries: Diminutive or absent MRA NECK FINDINGS Aortic arch: Normal 3 vessel aortic branching pattern. The visualized subclavian arteries are normal. Right carotid system: Normal course and caliber without stenosis or evidence of dissection. Left carotid system: Normal course and caliber without stenosis or evidence of dissection. Vertebral arteries: Left dominant. Vertebral artery origins are normal on the left, inadequately visualized on the right. The right vertebral artery is diffusely  diminutive. Vertebral arteries are normal in course and caliber to the vertebrobasilar confluence without stenosis or evidence of dissection. IMPRESSION: 1. Scattered small foci of acute ischemia within the right MCA territory, including along the precentral gyrus. No hemorrhage or mass effect. 2. Normal MRA of the head and neck. Electronically Signed   By: Ulyses Jarred M.D.   On: 04/19/2019 19:15   Dg Chest Portable 1 View  Result Date: 04/19/2019 CLINICAL DATA:  Shortness of breath EXAM: PORTABLE CHEST 1 VIEW COMPARISON:  None. FINDINGS: The heart size and mediastinal contours are within normal limits accounting for technique. Moderate patchy bilateral peripheral predominant interstitial and airspace opacities are noted. There is no pleural effusion or pneumothorax. The visualized skeletal structures are unremarkable. IMPRESSION: 1. Moderate patchy bilateral peripheral predominant interstitial and airspace opacities compatible with COVID-19 pneumonia. Electronically Signed   By: Zerita Boers M.D.   On: 04/19/2019 17:25    PHYSICAL EXAM Pleasant elderly Caucasian male sitting up in bedside chair.  Does not appear to be in significant distress but is on nasal cannula oxygen.  He has mild intermittent cough but is not short of breath. Afebrile. Head is nontraumatic. Neck is supple without bruit.    Cardiac exam no murmur or gallop. Lungs are clear to auscultation. Distal pulses are well felt. Neurological Exam ;  Awake  Alert oriented x 3. Normal speech and language.eye movements full without nystagmus.fundi were not visualized. Vision acuity and fields appear normal. Hearing is normal. Palatal movements are normal. Face symmetric. Tongue midline. Normal strength, tone, reflexes and coordination except mild subjective weakness of left hip flexors and ankle dorsiflexors only.. Normal sensation except mild paresthesias in the left hand.. Gait deferred.  ASSESSMENT/PLAN Raymond Camacho is a 78 y.o.  male with history of PVCs w/ recent dx COVID 04/10/2019 when tested d/t respiratory symptoms not requiring hospitalization who developed transient L hand tinging and L sided weakness > 24h ago.  Stroke:   Scattered M MCA territory infarcts in setting of recent COVID as well as hx of palpations s/p ablation  CT head normal  MRI  Scattered R MCA territory infarcts  MRA head & neck normal  2D Echo ejection fraction 60 to 65%.  No wall motion abnormalities   LDL 83  HgbA1c 6.6  Lovenox 40 mg sq daily for VTE prophylaxis  aspirin 81 mg daily prior to admission, now on aspirin 325 mg daily. Given possibility of post COVID hypercoagulable state, will treat with Eliquis x 3 months. Will ask pharmacy to dose. Dr. Leonie Man discussed with Dr Wyline Copas   Therapy recommendations: No follow-up necessary  Disposition:  pending   COVID-19 PNA  Tested positive 04/10/2019 following some resrpiatory sx  Not hospitalized at time of dx  POC COVID test neg at [facility] and treated as positive per Dr. 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   Snores-at risk for sleep apnoea  Other Active Problems  Covid positive status  Hospital day # 2  He presented with embolic right MCA branch infarct in the setting of being Covid positive and his stroke may be related to Covid related hypercoagulability though alternative etiologies like cardiac source of embolism or paroxysmal A. fib are also possible.  Would normally recommend dual antiplatelet therapy for 3 weeks followed by aspirin  alone but in case his Covid status merits short-term anticoagulation it would be okay to do Eliquis for 3 months and switch to aspirin after that. continue cardiac monitoring.  Aggressive risk factor modification.  Long discussion with patient as well as with Dr. Justice Rocher and answered questions.  Greater than 50% time during this 25-minute visit was spent on counseling and coordination of care about his embolic stroke CODE STATUS and hypercoagulability discussion and answering questions.  Follow-up as an outpatient with stroke clinic in 6 weeks.  Stroke team will sign off.  Kindly call for questions if any. Antony Contras, MD Medical Director [facility] Pager: [phone] 04/21/2019 3:06 PM  To contact Stroke Continuity provider, please refer to [email or web address] After hours, contact General Neurology

## 2019-04-21 NOTE — Progress Notes (Signed)
PROGRESS NOTE    Raymond Camacho  [id number] DOB: [birth year] DOA: 04/19/2019 PCP: Burnard Bunting, MD   Brief Narrative: 78 year old with past medical history significant for PVC's who presented with acute onset left-sided weakness that started the afternoon on the day prior to admission.  Of note Patient was evaluated at   [facility] where he was tested positive for Covid on 11/27, he was having fevers, and weakness.   MRI showed scattered small foci of acute ischemia within the right MCA, and the precentral gyrus.  Normal MRA of head and neck.   Assessment & Plan:   Active Problems:   PVC (premature ventricular contraction)   Obesity (BMI 30-39.9)   Stroke ([place])   Pneumonia due to COVID-19 virus   Respiratory failure with hypoxia ([place])  1-Acute Stroke: Patient presented with left-sided weakness which has improved. Patient was evaluated by neurology who recommend 3 months of Eliquis followed by aspirin afterwards.  Patient to follow-up with Dr. Leonie Man in  6 weeks after discharge Continue with Lipitor. Continue with Eliquis. PT OT. Echo no evidence of thrombus.  2-COVID-19 pneumonia, Acute hypoxic respiratory failure: -Patient was confirmed to be positive for Covid from [place] test from 11/24, results were faxed and personally reviewed by Dr. Levora Angel.  -Chest x-ray with bilateral infiltrates.  Patient was hypoxic oxygen sats as low as 89 on room air. -Continue with Remdesivir total of 5 days. -Continue with Dexamethasone for 10 days.  -CRP trending down 12.6----7. -Continue with vitamin C and zinc.  3-Sinus Tachycardia: On flecainide   4-AKI: Creatinine increased to 1.3.  From 1.0 on admission. Encourage oral intake   Estimated body mass index is 28.55 kg/m as calculated from the following:   Height as of 03/18/13: 5\' 11"  (1.803 m).   Weight as of this encounter: 92.9 kg.   DVT prophylaxis: Eliquis Code Status: Full code Family Communication: care  discussed with patient.  Disposition Plan: remain in the hospital to complete Remdesivir Consultants:   Neurology  Procedures:   ECHO; no evidence of thrombosis.   Antimicrobials: Others; Remdesivir 12-06   Subjective: He report resolution of left hand weakness, left LE weakness.  He is breathing ok, mild cough.   Objective: Vitals:   04/21/19 0100 04/21/19 0534 04/21/19 0833 04/21/19 1249  BP: 119/64 (!) 145/93 (!) 145/82 138/86  Pulse: 78  86 79  Resp: (!) 25  16 19   Temp: 98.7 F (37.1 C) 98.3 F (36.8 C) 98.5 F (36.9 C) 98.2 F (36.8 C)  TempSrc: Oral Oral Oral Oral  SpO2: 95%  96% 93%  Weight:       No intake or output data in the 24 hours ending 04/21/19 1310 Filed Weights   04/20/19 2343  Weight: 92.9 kg    Examination:  General exam: Appears calm and comfortable  Respiratory system: Clear to auscultation. Respiratory effort normal. Cardiovascular system: S1 & S2 heard, RRR. No JVD, murmurs, rubs, gallops or clicks. No pedal edema. Gastrointestinal system: Abdomen is nondistended, soft and nontender. No organomegaly or masses felt. Normal bowel sounds heard. Central nervous system: Alert and oriented. No focal neurological deficits. Extremities: Symmetric 5 x 5 power. Skin: No rashes, lesions or ulcers    Data Reviewed: I have personally reviewed following labs and imaging studies  CBC: Recent Labs  Lab 04/19/19 1216 04/19/19 1917  WBC 9.5 8.9  NEUTROABS 6.5  --   HGB 13.7 13.5  HCT 40.3 39.7  MCV 89.0 89.8  PLT 358  [id number]   Basic Metabolic Panel: Recent Labs  Lab 04/19/19 1216 04/19/19 1917 04/20/19 0400 04/21/19 0540  NA 136  --  135 136  K 4.4  --  4.9 4.9  CL 102  --  102 104  CO2 22  --  21* 23  GLUCOSE 92  --  140* 158*  BUN 31*  --  30* 36*  CREATININE 1.19 1.32* 1.10 1.29*  CALCIUM 8.8*  --  8.5* 8.8*   GFR: CrCl cannot be calculated (Unknown ideal weight.). Liver Function Tests: Recent Labs  Lab 04/19/19 1216  04/20/19 0400 04/21/19 0540  AST 52* 55* 37  ALT 74* 81* 78*  ALKPHOS 63 67 58  BILITOT 0.8 0.6 0.4  PROT 6.6 6.8 6.8  ALBUMIN 2.5* 2.5* 2.7*   No results for input(s): LIPASE, AMYLASE in the last 168 hours. No results for input(s): AMMONIA in the last 168 hours. Coagulation Profile: Recent Labs  Lab 04/19/19 1216  INR 1.2   Cardiac Enzymes: No results for input(s): CKTOTAL, CKMB, CKMBINDEX, TROPONINI in the last 168 hours. BNP (last 3 results) No results for input(s): PROBNP in the last 8760 hours. HbA1C: Recent Labs    04/19/19 1917  HGBA1C 6.6*   CBG: No results for input(s): GLUCAP in the last 168 hours. Lipid Profile: Recent Labs    04/20/19 1057  CHOL 127  HDL 23*  LDLCALC 83  TRIG 106  CHOLHDL 5.5   Thyroid Function Tests: No results for input(s): TSH, T4TOTAL, FREET4, T3FREE, THYROIDAB in the last 72 hours. Anemia Panel: Recent Labs    04/20/19 0400 04/21/19 0540  FERRITIN 893* 906*   Sepsis Labs: No results for input(s): PROCALCITON, LATICACIDVEN in the last 168 hours.  Recent Results (from the past 240 hour(s))  SARS CORONAVIRUS 2 (TAT 6-24 HRS) Nasopharyngeal Nasopharyngeal Swab     Status: None   Collection Time: 04/19/19  4:39 PM   Specimen: Nasopharyngeal Swab  Result Value Ref Range Status   SARS Coronavirus 2 NEGATIVE NEGATIVE Final    Comment: (NOTE) SARS-CoV-2 target nucleic acids are NOT DETECTED. The SARS-CoV-2 RNA is generally detectable in upper and lower respiratory specimens during the acute phase of infection. Negative results do not preclude SARS-CoV-2 infection, do not rule out co-infections with other pathogens, and should not be used as the sole basis for treatment or other patient management decisions. Negative results must be combined with clinical observations, patient history, and epidemiological information. The expected result is Negative. Fact Sheet for Patients: SugarRoll[employer]be Fact  Sheet for Healthcare Providers: https://www.woods-mathews.com/ This test is not yet approved or cleared by the [place] FDA and  has been authorized for detection and/or diagnosis of SARS-CoV-2 by FDA under an Emergency Use Authorization (EUA). This EUA will remain  in effect (meaning this test can be used) for the duration of the COVID-19 declaration under Section 56 4(b)(1) of the Act, 21 U.S.C. section 360bbb-3(b)(1), unless the authorization is terminated or revoked sooner. Performed at [facility], [place] [address] [place], [place] [address]          Radiology Studies: Ct Head Wo Contrast  Result Date: 04/19/2019 CLINICAL DATA:  Possible stroke, onset of lightheadedness yesterday at 1600 hours, diagnosed with COVID-19 1 week ago, former smoker EXAM: CT HEAD WITHOUT CONTRAST TECHNIQUE: Contiguous axial images were obtained from the base of the skull through the vertex without intravenous contrast. Sagittal and coronal MPR images reconstructed from axial data set. COMPARISON:  MR head 06/25/2006 FINDINGS:  Brain: Normal ventricular morphology. No midline shift or mass effect. Normal appearance of brain parenchyma. No intracranial hemorrhage, mass lesion or evidence of acute infarction. No extra-axial fluid collections. Vascular: No hyperdense vessels Skull: Intact Sinuses/Orbits: Clear Other: N/A IMPRESSION: Normal exam. Electronically Signed   By: Lavonia Dana M.D.   On: 04/19/2019 15:19   Mr Angio Head Wo Contrast  Result Date: 04/19/2019 CLINICAL DATA:  Left hand weakness and ataxia. EXAM: MRI HEAD WITHOUT CONTRAST MRA HEAD WITHOUT CONTRAST MRA NECK WITHOUT CONTRAST TECHNIQUE: Multiplanar, multiecho pulse sequences of the brain and surrounding structures were obtained without intravenous contrast. Angiographic images of the Circle of Willis were obtained using MRA technique without intravenous contrast. Angiographic images of the neck were obtained using MRA  technique without intravenous contrast. Carotid stenosis measurements (when applicable) are obtained utilizing NASCET criteria, using the distal internal carotid diameter as the denominator. The patient declined the administration of intravenous contrast agent. COMPARISON:  Brain MRI 06/25/2006 FINDINGS: MRI HEAD FINDINGS BRAIN: The midline structures are normal. There are scattered punctate foci of abnormal diffusion restriction within the right MCA territory, including along the precentral gyrus. There is no hemorrhage or mass effect. Minimal white matter hyperintensity, nonspecific and commonly seen in asymptomatic patients of this age. The CSF spaces are normal for age, with no hydrocephalus. Blood-sensitive sequences show no chronic microhemorrhage or superficial siderosis. SKULL AND UPPER CERVICAL SPINE: The visualized skull base, calvarium, upper cervical spine and extracranial soft tissues are normal. SINUSES/ORBITS: No fluid levels or advanced mucosal thickening. No mastoid or middle ear effusion. The orbits are normal. MRA HEAD FINDINGS POSTERIOR CIRCULATION: --Basilar artery: Normal. --Posterior cerebral arteries: Normal. Both originate from the basilar artery. --Superior cerebellar arteries: Normal. --Inferior cerebellar arteries: Normal anterior and posterior inferior cerebellar arteries. ANTERIOR CIRCULATION: --Intracranial internal carotid arteries: Normal. --Anterior cerebral arteries: Normal. Both A1 segments are present. Patent anterior communicating artery. --Middle cerebral arteries: Normal. --Posterior communicating arteries: Diminutive or absent MRA NECK FINDINGS Aortic arch: Normal 3 vessel aortic branching pattern. The visualized subclavian arteries are normal. Right carotid system: Normal course and caliber without stenosis or evidence of dissection. Left carotid system: Normal course and caliber without stenosis or evidence of dissection. Vertebral arteries: Left dominant. Vertebral artery  origins are normal on the left, inadequately visualized on the right. The right vertebral artery is diffusely diminutive. Vertebral arteries are normal in course and caliber to the vertebrobasilar confluence without stenosis or evidence of dissection. IMPRESSION: 1. Scattered small foci of acute ischemia within the right MCA territory, including along the precentral gyrus. No hemorrhage or mass effect. 2. Normal MRA of the head and neck. Electronically Signed   By: Ulyses Jarred M.D.   On: 04/19/2019 19:15   Mr Angio Neck Wo Contrast  Result Date: 04/19/2019 CLINICAL DATA:  Left hand weakness and ataxia. EXAM: MRI HEAD WITHOUT CONTRAST MRA HEAD WITHOUT CONTRAST MRA NECK WITHOUT CONTRAST TECHNIQUE: Multiplanar, multiecho pulse sequences of the brain and surrounding structures were obtained without intravenous contrast. Angiographic images of the Circle of Willis were obtained using MRA technique without intravenous contrast. Angiographic images of the neck were obtained using MRA technique without intravenous contrast. Carotid stenosis measurements (when applicable) are obtained utilizing NASCET criteria, using the distal internal carotid diameter as the denominator. The patient declined the administration of intravenous contrast agent. COMPARISON:  Brain MRI 06/25/2006 FINDINGS: MRI HEAD FINDINGS BRAIN: The midline structures are normal. There are scattered punctate foci of abnormal diffusion restriction within the right MCA territory, including along  the precentral gyrus. There is no hemorrhage or mass effect. Minimal white matter hyperintensity, nonspecific and commonly seen in asymptomatic patients of this age. The CSF spaces are normal for age, with no hydrocephalus. Blood-sensitive sequences show no chronic microhemorrhage or superficial siderosis. SKULL AND UPPER CERVICAL SPINE: The visualized skull base, calvarium, upper cervical spine and extracranial soft tissues are normal. SINUSES/ORBITS: No fluid  levels or advanced mucosal thickening. No mastoid or middle ear effusion. The orbits are normal. MRA HEAD FINDINGS POSTERIOR CIRCULATION: --Basilar artery: Normal. --Posterior cerebral arteries: Normal. Both originate from the basilar artery. --Superior cerebellar arteries: Normal. --Inferior cerebellar arteries: Normal anterior and posterior inferior cerebellar arteries. ANTERIOR CIRCULATION: --Intracranial internal carotid arteries: Normal. --Anterior cerebral arteries: Normal. Both A1 segments are present. Patent anterior communicating artery. --Middle cerebral arteries: Normal. --Posterior communicating arteries: Diminutive or absent MRA NECK FINDINGS Aortic arch: Normal 3 vessel aortic branching pattern. The visualized subclavian arteries are normal. Right carotid system: Normal course and caliber without stenosis or evidence of dissection. Left carotid system: Normal course and caliber without stenosis or evidence of dissection. Vertebral arteries: Left dominant. Vertebral artery origins are normal on the left, inadequately visualized on the right. The right vertebral artery is diffusely diminutive. Vertebral arteries are normal in course and caliber to the vertebrobasilar confluence without stenosis or evidence of dissection. IMPRESSION: 1. Scattered small foci of acute ischemia within the right MCA territory, including along the precentral gyrus. No hemorrhage or mass effect. 2. Normal MRA of the head and neck. Electronically Signed   By: Ulyses Jarred M.D.   On: 04/19/2019 19:15   Mr Brain Wo Contrast  Result Date: 04/19/2019 CLINICAL DATA:  Left hand weakness and ataxia. EXAM: MRI HEAD WITHOUT CONTRAST MRA HEAD WITHOUT CONTRAST MRA NECK WITHOUT CONTRAST TECHNIQUE: Multiplanar, multiecho pulse sequences of the brain and surrounding structures were obtained without intravenous contrast. Angiographic images of the Circle of Willis were obtained using MRA technique without intravenous contrast.  Angiographic images of the neck were obtained using MRA technique without intravenous contrast. Carotid stenosis measurements (when applicable) are obtained utilizing NASCET criteria, using the distal internal carotid diameter as the denominator. The patient declined the administration of intravenous contrast agent. COMPARISON:  Brain MRI 06/25/2006 FINDINGS: MRI HEAD FINDINGS BRAIN: The midline structures are normal. There are scattered punctate foci of abnormal diffusion restriction within the right MCA territory, including along the precentral gyrus. There is no hemorrhage or mass effect. Minimal white matter hyperintensity, nonspecific and commonly seen in asymptomatic patients of this age. The CSF spaces are normal for age, with no hydrocephalus. Blood-sensitive sequences show no chronic microhemorrhage or superficial siderosis. SKULL AND UPPER CERVICAL SPINE: The visualized skull base, calvarium, upper cervical spine and extracranial soft tissues are normal. SINUSES/ORBITS: No fluid levels or advanced mucosal thickening. No mastoid or middle ear effusion. The orbits are normal. MRA HEAD FINDINGS POSTERIOR CIRCULATION: --Basilar artery: Normal. --Posterior cerebral arteries: Normal. Both originate from the basilar artery. --Superior cerebellar arteries: Normal. --Inferior cerebellar arteries: Normal anterior and posterior inferior cerebellar arteries. ANTERIOR CIRCULATION: --Intracranial internal carotid arteries: Normal. --Anterior cerebral arteries: Normal. Both A1 segments are present. Patent anterior communicating artery. --Middle cerebral arteries: Normal. --Posterior communicating arteries: Diminutive or absent MRA NECK FINDINGS Aortic arch: Normal 3 vessel aortic branching pattern. The visualized subclavian arteries are normal. Right carotid system: Normal course and caliber without stenosis or evidence of dissection. Left carotid system: Normal course and caliber without stenosis or evidence of  dissection. Vertebral arteries: Left dominant. Vertebral artery  origins are normal on the left, inadequately visualized on the right. The right vertebral artery is diffusely diminutive. Vertebral arteries are normal in course and caliber to the vertebrobasilar confluence without stenosis or evidence of dissection. IMPRESSION: 1. Scattered small foci of acute ischemia within the right MCA territory, including along the precentral gyrus. No hemorrhage or mass effect. 2. Normal MRA of the head and neck. Electronically Signed   By: Ulyses Jarred M.D.   On: 04/19/2019 19:15   Dg Chest Portable 1 View  Result Date: 04/19/2019 CLINICAL DATA:  Shortness of breath EXAM: PORTABLE CHEST 1 VIEW COMPARISON:  None. FINDINGS: The heart size and mediastinal contours are within normal limits accounting for technique. Moderate patchy bilateral peripheral predominant interstitial and airspace opacities are noted. There is no pleural effusion or pneumothorax. The visualized skeletal structures are unremarkable. IMPRESSION: 1. Moderate patchy bilateral peripheral predominant interstitial and airspace opacities compatible with COVID-19 pneumonia. Electronically Signed   By: Zerita Boers M.D.   On: 04/19/2019 17:25        Scheduled Meds:  albuterol  2 puff Inhalation Q6H   apixaban  5 mg Oral BID   atorvastatin  40 mg Oral q1800   dexamethasone  6 mg Oral Q24H   sodium chloride flush  3 mL Intravenous Once   vitamin C  1,000 mg Oral Daily   zinc sulfate  220 mg Oral Daily   Continuous Infusions:  remdesivir 100 mg in NS 100 mL Stopped (04/20/19 1007)     LOS: 2 days    Time spent: 35 minutes.     Elmarie Shiley, MD [facility]   If 7PM-7AM, please contact night-coverage [email or web address] Password [email or web address] 04/21/2019, 1:10 PM

## 2019-04-21 NOTE — Progress Notes (Signed)
Patient requesting to be started on home dose of flecainide. Patient states he has not taken it since he has been here. No active order for flecainide. Tylene Fantasia, on call, paged and notified.

## 2019-04-21 NOTE — Evaluation (Signed)
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  Of note Patient was evaluated at [facility] where he was tested positive for Covid on 11/27, he was having fevers, and weakness. MRI showed scattered small foci of acute ischemia within the right MCA, and the precentral gyrus.  Normal MRA of head and neck.   Clinical Impression  PTA pt primary caregiver for wife providing min-mod physical assist. Pt lives in 2 story home with 4 steps to enter and was completely independent and driving. Pt is currently limited in safe mobility by mild L sided weakness, and L sided motor planning with gait. Pt is mod I for bed mobility and min guard for transfers. Pt ambulated 150 feet with hands on min guard progressing to min guard for decreased L foot clearance in swing through. PT will continue to work with pt acutely however pt will not need additional PT services at discharge.    Follow Up Recommendations No PT follow up;Supervision for mobility/OOB    Equipment Recommendations  Rolling walker with 5" wheels       Precautions / Restrictions Precautions Precautions: Fall Restrictions Weight Bearing Restrictions: No      Mobility  Bed Mobility Overal bed mobility: Modified Independent             General bed mobility comments: HOB elevated  Transfers Overall transfer level: Needs assistance Equipment used: None;Rolling walker (2 wheeled) Transfers: Sit to/from Stand Sit to Stand: Supervision;Min guard         General transfer comment: for lines and safety  Ambulation/Gait Ambulation/Gait assistance: Min guard Gait Distance (Feet): 150 Feet Assistive device: Rolling walker (2 wheeled) Gait Pattern/deviations: Step-through  pattern;Decreased dorsiflexion - left Gait velocity: slowed Gait velocity interpretation: <1.8 ft/sec, indicate of risk for recurrent falls General Gait Details: hands on min guard progressing to min guard for slow, mildly unsteady gait, pt with difficulty with L LE placement either does not have enough dorsiflexion, hip flexion and toes drag or with increased hip flexion pt foot slaps down on floor     Modified Rankin (Stroke Patients Only) Modified Rankin (Stroke Patients Only) Pre-Morbid Rankin Score: No symptoms Modified Rankin: No significant disability     Balance Overall balance assessment: Needs assistance Sitting-balance support: Feet supported Sitting balance-Leahy Scale: Good     Standing balance support: Bilateral upper extremity supported;No upper extremity supported Standing balance-Leahy Scale: Fair Standing balance comment: improved stability with bil UE support                             Pertinent Vitals/Pain Pain Assessment: No/denies pain    Home Living Family/patient expects to be discharged to:: Private residence Living Arrangements: Spouse/significant other(however is primary caregiver for wife who has COVID) Available Help at Discharge: Family;Available PRN/intermittently Type of Home: House Home Access: Stairs to enter Entrance Stairs-Rails: Left Entrance Stairs-Number of Steps: 4 Home Layout: Bed/bath upstairs;Two level Home Equipment: Shower seat - built in;Grab bars - tub/shower;Bedside commode;Cane - single point;Electric scooter;Hand held shower head      Prior Function Level of Independence: Independent         Comments: primary caregiver to wife including physical assist, driving      Hand Dominance   Dominant Hand:  Right    Extremity/Trunk Assessment   Upper Extremity Assessment Upper Extremity Assessment: Defer to OT evaluation LUE Deficits / Details: grossly 4/5 throughout, mild weakness compared to RUE (RUE  5/5) LUE Sensation: WNL LUE Coordination: WNL    Lower Extremity Assessment Lower Extremity Assessment: LLE deficits/detail LLE Deficits / Details: grossly 4/5 throughout, weaker proximally, weaker in comparison to R LE LLE Sensation: ("feels funny") LLE Coordination: WNL       Communication   Communication: No difficulties  Cognition Arousal/Alertness: Awake/alert Behavior During Therapy: WFL for tasks assessed/performed Overall Cognitive Status: Within Functional Limits for tasks assessed                                 General Comments: reports some mental fogginess from [place] like he should have called EMS when he had stroke symptoms but went to bed instead although reports it is clearing as he was able to due his daily [place] for the first time in weeks      General Comments General comments (skin integrity, edema, etc.): Pt on 2L O2 on entry with SaO2 93%O2, able to ambulate on RA with SaO2 maintained >91%O2        Assessment/Plan    PT Assessment Patient needs continued PT services  PT Problem List Decreased strength;Impaired sensation       PT Treatment Interventions DME instruction;Gait training;Stair training;Functional mobility training;Therapeutic activities;Therapeutic exercise;Balance training;Cognitive remediation;Patient/family education    PT Goals (Current goals can be found in the Care Plan section)  Acute Rehab PT Goals Patient Stated Goal: home when able PT Goal Formulation: With patient Time For Goal Achievement: 05/05/19 Potential to Achieve Goals: Good    Frequency Min 3X/week        Co-evaluation PT/OT/SLP Co-Evaluation/Treatment: Yes Reason for Co-Treatment: For patient/therapist safety PT goals addressed during session: Mobility/safety with mobility OT goals addressed during session: ADL's and self-care       AM-PAC PT "6 Clicks" Mobility  Outcome Measure Help needed turning from your back to your side while in a  flat bed without using bedrails?: None Help needed moving from lying on your back to sitting on the side of a flat bed without using bedrails?: None Help needed moving to and from a bed to a chair (including a wheelchair)?: None Help needed standing up from a chair using your arms (e.g., wheelchair or bedside chair)?: None Help needed to walk in hospital room?: A Little Help needed climbing 3-5 steps with a railing? : A Lot 6 Click Score: 21    End of Session Equipment Utilized During Treatment: Gait belt Activity Tolerance: Patient tolerated treatment well Patient left: in chair;with call bell/phone within reach Nurse Communication: Mobility status PT Visit Diagnosis: Unsteadiness on feet (R26.81);Other abnormalities of gait and mobility (R26.89);History of falling (Z91.81);Difficulty in walking, not elsewhere classified (R26.2);Other symptoms and signs involving the nervous system [id number])    Time: [id number] PT Time Calculation (min) (ACUTE ONLY): 33 min   Charges:   PT Evaluation $PT Eval Moderate Complexity: 1 Mod          Rucker Pridgeon B. Migdalia Dk PT, DPT Acute Rehabilitation Services Pager [phone] Office [phone]   [place] 04/21/2019, 2:35 PM

## 2019-04-21 NOTE — Evaluation (Signed)
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  Of note Patient was evaluated at [facility] where he was tested positive for Covid on 11/27, he was having fevers, and weakness. MRI showed scattered small foci of acute ischemia within the right MCA, and the precentral gyrus.  Normal MRA of head and neck.    Clinical Impression   This 78 y/o male presents with the above. PTA pt reports independence with ADL/iADL and functional mobility. Pt presenting with mild L side weakness, decreased standing balance. Pt currently requiring minA-minguard assist for functional mobility in room/hallway using RW. He currently requires close minguard assist for LB ADL. Pt trialled on RA during this session with SpO2 >/=91%. He will benefit from continued acute OT services prior to return home to maximize his safety and independence with ADL and mobility. Will follow.      Follow Up Recommendations  No OT follow up;Supervision - Intermittent    Equipment Recommendations  None recommended by OT           Precautions / Restrictions Precautions Precautions: Fall Restrictions Weight Bearing Restrictions: No      Mobility Bed Mobility Overal bed mobility: Modified Independent             General bed mobility comments: HOB elevated  Transfers Overall transfer level: Needs assistance Equipment used: None;Rolling walker (2 wheeled) Transfers: Sit to/from Stand Sit to Stand: Supervision;Min guard         General transfer comment: for lines and safety    Balance Overall balance assessment: Needs assistance Sitting-balance support: Feet supported Sitting balance-Leahy Scale: Good     Standing balance support: Bilateral upper extremity  supported;No upper extremity supported Standing balance-Leahy Scale: Fair Standing balance comment: improved stability with bil UE support                           ADL either performed or assessed with clinical judgement   ADL Overall ADL's : Needs assistance/impaired Eating/Feeding: Modified independent;Sitting   Grooming: Min guard;Minimal assistance;Standing   Upper Body Bathing: Min guard;Sitting   Lower Body Bathing: Sit to/from stand;Min guard;Minimal assistance   Upper Body Dressing : Set up;Min guard;Sitting   Lower Body Dressing: Min guard;Minimal assistance;Sit to/from stand Lower Body Dressing Details (indicate cue type and reason): pt able to doff/don socks seated in recliner Toilet Transfer: Minimal assistance;Ambulation;RW Toilet Transfer Details (indicate cue type and reason): simulated via transfer to recliner, room/hallway level mobility Toileting- Clothing Manipulation and Hygiene: Min guard;Sit to/from stand       Functional mobility during ADLs: Minimal assistance;Min guard;Rolling walker       Vision Baseline Vision/History: Wears glasses(contacts) Wears Glasses: At all times Patient Visual Report: No change from baseline Vision Assessment?: No apparent visual deficits     Perception     Praxis      Pertinent Vitals/Pain Pain Assessment: No/denies pain     Hand Dominance Right   Extremity/Trunk Assessment Upper Extremity Assessment Upper Extremity Assessment: LUE deficits/detail LUE Deficits / Details: grossly 4/5 throughout, mild weakness compared to RUE (RUE 5/5) LUE Sensation: WNL LUE Coordination: WNL   Lower Extremity Assessment Lower Extremity Assessment: Defer to PT evaluation       Communication Communication Communication: No difficulties  Cognition Arousal/Alertness: Awake/alert Behavior During Therapy: WFL for tasks assessed/performed Overall Cognitive Status: Within Functional Limits for tasks assessed                                      General Comments       Exercises     Shoulder Instructions      Home Living Family/patient expects to be discharged to:: Private residence Living Arrangements: Spouse/significant other(however is primary caregiver for wife who has COVID) Available Help at Discharge: Family;Available PRN/intermittently Type of Home: House Home Access: Stairs to enter [employer] of Steps: 4 Entrance Stairs-Rails: Left Home Layout: Bed/bath upstairs;Two level Alternate Level Stairs-Number of Steps: 15 Alternate Level Stairs-Rails: Can reach both;Left;Right(Right from bottom to top, both to landing) Bathroom Shower/Tub: Occupational psychologist: Standard Bathroom Accessibility: Yes   Home Equipment: Shower seat - built in;Grab bars - tub/shower;Bedside commode;Cane - single point;Electric scooter;Hand held shower head          Prior Functioning/Environment Level of Independence: Independent        Comments: primary caregiver to wife including physical assist, driving         OT Problem List: Decreased strength;Decreased range of motion;Decreased activity tolerance;Impaired balance (sitting and/or standing);Decreased knowledge of use of DME or AE      OT Treatment/Interventions: Self-care/ADL training;Therapeutic exercise;Energy conservation;DME and/or AE instruction;Therapeutic activities;Patient/family education;Balance training    OT Goals(Current goals can be found in the care plan section) Acute Rehab OT Goals Patient Stated Goal: home when able OT Goal Formulation: With patient Time For Goal Achievement: 05/05/19 Potential to Achieve Goals: Good  OT Frequency: Min 2X/week   Barriers to D/C:            Co-evaluation PT/OT/SLP Co-Evaluation/Treatment: Yes Reason for Co-Treatment: For patient/therapist safety;To address functional/ADL transfers   OT goals addressed during session: ADL's and self-care       AM-PAC OT "6 Clicks" Daily Activity     Outcome Measure Help from another person eating meals?: None Help from another person taking care of personal grooming?: None Help from another person toileting, which includes using toliet, bedpan, or urinal?: None Help from another person bathing (including washing, rinsing, drying)?: A Little Help from another person to put on and taking off regular upper body clothing?: None Help from another person to put on and taking off regular lower body clothing?: A Little 6 Click Score: 22   End of Session Equipment Utilized During Treatment: Gait belt;Rolling walker Nurse Communication: Mobility status  Activity Tolerance: Patient tolerated treatment well Patient left: in chair;with call bell/phone within reach  OT Visit Diagnosis: Other symptoms and signs involving the nervous system (R29.898);Muscle weakness (generalized) (M62.81);Unsteadiness on feet (R26.81)                Time: [id number] OT Time Calculation (min): 33 min Charges:  OT General Charges $OT Visit: 1 Visit OT Evaluation $OT Eval Moderate Complexity: [place], OT [employer] Pager [phone] Office [phone]   Raymondo Band 04/21/2019, 2:05 PM

## 2019-04-21 NOTE — Plan of Care (Signed)
  Problem: Education: Goal: Knowledge of General Education information will improve Description: Including pain rating scale, medication(s)/side effects and non-pharmacologic comfort measures Outcome: Progressing   Problem: Health Behavior/Discharge Planning: Goal: Ability to manage health-related needs will improve Outcome: Progressing   Problem: Clinical Measurements: Goal: Ability to maintain clinical measurements within normal limits will improve Outcome: Progressing Goal: Will remain free from infection Outcome: Progressing Goal: Diagnostic test results will improve Outcome: Progressing Goal: Respiratory complications will improve Outcome: Progressing Goal: Cardiovascular complication will be avoided Outcome: Progressing   Problem: Activity: Goal: Risk for activity intolerance will decrease Outcome: Progressing   Problem: Nutrition: Goal: Adequate nutrition will be maintained Outcome: Progressing   Problem: Coping: Goal: Level of anxiety will decrease Outcome: Progressing   Problem: Elimination: Goal: Will not experience complications related to bowel motility Outcome: Progressing Goal: Will not experience complications related to urinary retention Outcome: Progressing   Problem: Pain Managment: Goal: General experience of comfort will improve Outcome: Progressing   Problem: Safety: Goal: Ability to remain free from injury will improve Outcome: Progressing   Problem: Skin Integrity: Goal: Risk for impaired skin integrity will decrease Outcome: Progressing   Problem: Education: Goal: Knowledge of secondary prevention will improve Outcome: Progressing Goal: Knowledge of patient specific risk factors addressed and post discharge goals established will improve Outcome: Progressing   Problem: Coping: Goal: Will identify appropriate support needs Outcome: Progressing   Problem: Self-Care: Goal: Verbalization of feelings and concerns over difficulty with  self-care will improve Outcome: Progressing Goal: Ability to communicate needs accurately will improve Outcome: Progressing   Problem: Nutrition: Goal: Risk of aspiration will decrease Outcome: Progressing Goal: Dietary intake will improve Outcome: Progressing   Problem: Ischemic Stroke/TIA Tissue Perfusion: Goal: Complications of ischemic stroke/TIA will be minimized Outcome: Progressing

## 2019-04-21 NOTE — Plan of Care (Signed)

## 2019-04-21 NOTE — Progress Notes (Signed)
Patient arrived to [id number] from ED. Patient alert and orientedx4. On 2L Raymond Camacho at 92%. Skin within defined limits. 20G PIV to right hand. Patient oriented to unit. Patient gave self bath sitting at sink. No complaints of SOB or CP. Patient states, "I feel really good!" patient lying in bed, call bell within reach. Will continue to monitor.

## 2019-04-22 DIAGNOSIS — J9601 Acute respiratory failure with hypoxia: Secondary | ICD-10-CM

## 2019-04-22 DIAGNOSIS — E669 Obesity, unspecified: Secondary | ICD-10-CM

## 2019-04-22 LAB — BASIC METABOLIC PANEL
Anion gap: 10 (ref 5–15)
BUN: 38 mg/dL — ABNORMAL HIGH (ref 8–23)
CO2: 24 mmol/L (ref 22–32)
Calcium: 9 mg/dL (ref 8.9–10.3)
Chloride: 102 mmol/L (ref 98–111)
Creatinine, Ser: 1.21 mg/dL (ref 0.61–1.24)
GFR calc Af Amer: >60 mL/min (ref >60)
GFR calc non Af Amer: 57 mL/min — ABNORMAL LOW (ref >60)
Glucose, Bld: 147 mg/dL — ABNORMAL HIGH (ref 70–99)
Potassium: 4.5 mmol/L (ref 3.5–5.1)
Sodium: 136 mmol/L (ref 135–145)

## 2019-04-22 LAB — FERRITIN: Ferritin: 820 ng/mL — ABNORMAL HIGH (ref 24–336)

## 2019-04-22 LAB — C-REACTIVE PROTEIN: CRP: 3.6 mg/dL — ABNORMAL HIGH (ref <1.0)

## 2019-04-22 MED ORDER — FLECAINIDE ACETATE 50 MG PO TABS
50.0000 mg | ORAL_TABLET | Freq: Two times a day (BID) | ORAL | Status: DC
  Administered 2019-04-22 – 2019-04-23 (×3): 50 mg via ORAL
  Filled 2019-04-22 (×3): qty 1

## 2019-04-22 NOTE — Progress Notes (Signed)
PROGRESS NOTE    Raymond Camacho  [id number] DOB: [birth year] DOA: 04/19/2019 PCP: Burnard Bunting, MD   Brief Narrative: Raymond Camacho is a 78 y.o. male with medical history significant of PVC's. He presented secondary to left sided weakness and found to have an acute stroke.   Assessment & Plan:   Active Problems:   PVC (premature ventricular contraction)   Obesity (BMI 30-39.9)   Stroke ([place])   Pneumonia due to COVID-19 virus   Respiratory failure with hypoxia ([place])   Acute stroke Patient with left sided weakness on presentation. MRI significant for evidence of scattered small foci of acute ischemia within right MCA territor. LDL of 83. Transthoracic Echocardiogram significant for no embolic source. Neurology consulted with recommendations for Eliquis x3 months. Concern for etiology from hypercoagulable state in setting of COVID-19 infection. -Continue Eliquis, Lipitor, -PT/OT recommendations: No follow-up  COVID-19 pneumonia Patient positive from 11/24. Reviewed previously. Ferritin stable. CRP improving. Patient is on Remdesivir and Decadron. -Continue Remdesivir and Decadron  Acute respiratory failure with hypoxia Secondary to COVID-19 pneumonia. Patient weaned to room air.  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  -Continue Flecainide  AKI Unknown baseline. Creatinine peaked at 1.32. Trended down. Outpatient follow-up.   DVT prophylaxis: Eliquis Code Status:   Code Status: Full Code Family Communication: None Disposition Plan: Discharge likely tomorrow after last dose of Remdesivir   Consultants:   Neurology  Procedures:   12/7: Transthoracic Echocardiogram IMPRESSIONS    1. Left ventricular ejection fraction, by visual estimation, is 60 to 65%. The left ventricle has normal function. There is no left ventricular hypertrophy.  2. Left ventricular diastolic parameters are indeterminate.  3. The left  ventricle has no regional wall motion abnormalities.  4. Global right ventricle has normal systolic function.The right ventricular size is normal. No increase in right ventricular wall thickness.  5. Left atrial size was normal.  6. Right atrial size was normal.  7. Presence of pericardial fat pad.  8. The mitral valve is grossly normal. Trace mitral valve regurgitation.  9. The tricuspid valve is normal in structure. Tricuspid valve regurgitation is trivial. 10. The aortic valve is grossly normal. Aortic valve regurgitation is not visualized. 11. The pulmonic valve was not well visualized. Pulmonic valve regurgitation is not visualized. 12. The ascending aorta was not well visualized. 13. The inferior vena cava is normal in size with greater than 50% respiratory variability, suggesting right atrial pressure of 3 mmHg.  Antimicrobials:  Remdesivir (12/6>>    Subjective: No concerns today.  Objective: Vitals:   04/21/19 1249 04/21/19 2117 04/22/19 0506 04/22/19 1300  BP: 138/86 113/68  132/87  Pulse: 79   86  Resp: 19   19  Temp: 98.2 F (36.8 C) 98.1 F (36.7 C) 97.8 F (36.6 C) 98.1 F (36.7 C)  TempSrc: Oral Oral Oral Oral  SpO2: 93%   94%  Weight:        Intake/Output Summary (Last 24 hours) at 04/22/2019 1324 Last data filed at 04/22/2019 0827 Gross per 24 hour  Intake 580 ml  Output --  Net 580 ml   Filed Weights   04/20/19 2343  Weight: 92.9 kg    Examination:  General exam: Appears calm and comfortable Respiratory system: Clear to auscultation. Respiratory effort normal. Cardiovascular system: S1 & S2 heard, RRR. No murmurs, rubs, gallops or clicks. Gastrointestinal system: Abdomen is nondistended, soft and nontender. No organomegaly or masses felt. Normal bowel sounds heard.  Central nervous system: Alert and oriented. No focal neurological deficits. Extremities: No edema. No calf tenderness Skin: No cyanosis. No rashes Psychiatry: Judgement and insight  appear normal. Mood & affect appropriate.     Data Reviewed: I have personally reviewed following labs and imaging studies  CBC: Recent Labs  Lab 04/19/19 1216 04/19/19 1917  WBC 9.5 8.9  NEUTROABS 6.5  --   HGB 13.7 13.5  HCT 40.3 39.7  MCV 89.0 89.8  PLT 358 [id number]*   Basic Metabolic Panel: Recent Labs  Lab 04/19/19 1216 04/19/19 1917 04/20/19 0400 04/21/19 0540 04/22/19 0500  NA 136  --  135 136 136  K 4.4  --  4.9 4.9 4.5  CL 102  --  102 104 102  CO2 22  --  21* 23 24  GLUCOSE 92  --  140* 158* 147*  BUN 31*  --  30* 36* 38*  CREATININE 1.19 1.32* 1.10 1.29* 1.21  CALCIUM 8.8*  --  8.5* 8.8* 9.0   GFR: CrCl cannot be calculated (Unknown ideal weight.). Liver Function Tests: Recent Labs  Lab 04/19/19 1216 04/20/19 0400 04/21/19 0540  AST 52* 55* 37  ALT 74* 81* 78*  ALKPHOS 63 67 58  BILITOT 0.8 0.6 0.4  PROT 6.6 6.8 6.8  ALBUMIN 2.5* 2.5* 2.7*   No results for input(s): LIPASE, AMYLASE in the last 168 hours. No results for input(s): AMMONIA in the last 168 hours. Coagulation Profile: Recent Labs  Lab 04/19/19 1216  INR 1.2   Cardiac Enzymes: No results for input(s): CKTOTAL, CKMB, CKMBINDEX, TROPONINI in the last 168 hours. BNP (last 3 results) No results for input(s): PROBNP in the last 8760 hours. HbA1C: Recent Labs    04/19/19 1917  HGBA1C 6.6*   CBG: No results for input(s): GLUCAP in the last 168 hours. Lipid Profile: Recent Labs    04/20/19 1057  CHOL 127  HDL 23*  LDLCALC 83  TRIG 106  CHOLHDL 5.5   Thyroid Function Tests: No results for input(s): TSH, T4TOTAL, FREET4, T3FREE, THYROIDAB in the last 72 hours. Anemia Panel: Recent Labs    04/21/19 0540 04/22/19 0500  FERRITIN 906* 820*   Sepsis Labs: No results for input(s): PROCALCITON, LATICACIDVEN in the last 168 hours.  Recent Results (from the past 240 hour(s))  SARS CORONAVIRUS 2 (TAT 6-24 HRS) Nasopharyngeal Nasopharyngeal Swab     Status: None   Collection  Time: 04/19/19  4:39 PM   Specimen: Nasopharyngeal Swab  Result Value Ref Range Status   SARS Coronavirus 2 NEGATIVE NEGATIVE Final    Comment: (NOTE) SARS-CoV-2 target nucleic acids are NOT DETECTED. The SARS-CoV-2 RNA is generally detectable in upper and lower respiratory specimens during the acute phase of infection. Negative results do not preclude SARS-CoV-2 infection, do not rule out co-infections with other pathogens, and should not be used as the sole basis for treatment or other patient management decisions. Negative results must be combined with clinical observations, patient history, and epidemiological information. The expected result is Negative. Fact Sheet for Patients: SugarRoll.be Fact Sheet for Healthcare Providers: https://www.woods-mathews.com/ This test is not yet approved or cleared by the [place] FDA and  has been authorized for detection and/or diagnosis of SARS-CoV-2 by FDA under an Emergency Use Authorization (EUA). This EUA will remain  in effect (meaning this test can be used) for the duration of the COVID-19 declaration under Section 56 4(b)(1) of the Act, 21 U.S.C. section 360bbb-3(b)(1), unless the authorization is terminated or revoked sooner.  Performed at [facility], [place] [address] [place], [place] [address]          Radiology Studies: No results found.      Scheduled Meds:  albuterol  2 puff Inhalation Q6H   apixaban  5 mg Oral BID   atorvastatin  40 mg Oral q1800   dexamethasone  6 mg Oral Q24H   sodium chloride flush  3 mL Intravenous Once   vitamin C  1,000 mg Oral Daily   zinc sulfate  220 mg Oral Daily   Continuous Infusions:  remdesivir 100 mg in NS 100 mL 100 mg (04/22/19 1142)     LOS: 3 days     Cordelia Poche, MD [facility] 04/22/2019, 1:24 PM  If 7PM-7AM, please contact night-coverage [email or web address]

## 2019-04-22 NOTE — TOC Initial Note (Addendum)
Transition of Care [facility]) - Initial/Assessment Note    Patient Details  Name: Raymond Camacho MRN: [id number] Date of Birth: [birth year]  Transition [facility] CM/SW Contact:    Maryclare Labrador, RN Phone Number: 04/22/2019, 11:46 AM  Clinical Narrative:      PTA independent from home.  [facility] [place] [facility]y [facility].  Pt in agreement for CM to inform [facility] of admit.  CM communicated admit with [facility] transfer coordinator Clarene Critchley - no forms requested at this time.  CM text paged pts [facility] SW [place] at [phone].  Pt only utilizes [employer].  Pt currently on Eliquis - pt will be given free 30 day card prior to discharge.  CM will inform SW of new Eliquis need and need for follow up appt.  Pt is interested in RW as recommended - pt would like to utilize his medicare benefit.  Pt given choice of DME company - pt chose [employer] - agency contacted and referral accepted.       Update:  Pt does not have Medicare part B -  Therefore DME will not be covered.  CM still awaiting call from [place] - request will be made for RW         Expected Discharge Plan: Home/Self Care     Patient Goals and CMS Choice Patient states their goals for this hospitalization and ongoing recovery are:: Pt states he is ready to get back home and get stronger from stroke CMS Medicare.gov Compare Post Acute Care list provided to:: Patient Choice offered to / list presented to : Patient  Expected Discharge Plan and Services Expected Discharge Plan: Home/Self Care       Living arrangements for the past 2 months: Single Family Home                 DME Arranged: Walker rolling DME Agency: [employer] Date DME Agency Contacted: 04/22/19 Time DME Agency Contacted: [id number] Representative spoke with at DME Agency: [place]            Prior Living Arrangements/Services Living arrangements for the past 2 months: [place]   Patient language and need for interpreter reviewed:: Yes Do  you feel safe going back to the place where you live?: Yes      Need for Family Participation in Patient Care: No (Comment) Care giver support system in place?: Yes (comment)   Criminal Activity/Legal Involvement Pertinent to Current Situation/Hospitalization: No - Comment as needed  Activities of Daily Living Home Assistive Devices/Equipment: None ADL Screening (condition at time of admission) Patient's cognitive ability adequate to safely complete daily activities?: Yes Is the patient deaf or have difficulty hearing?: No Does the patient have difficulty seeing, even when wearing glasses/contacts?: No Does the patient have difficulty concentrating, remembering, or making decisions?: No Patient able to express need for assistance with ADLs?: Yes Does the patient have difficulty dressing or bathing?: No Independently performs ADLs?: Yes (appropriate for developmental age) Does the patient have difficulty walking or climbing stairs?: No Weakness of Legs: None Weakness of Arms/Hands: None  Permission Sought/Granted   Permission granted to share information with : Yes, Verbal Permission Granted     Permission granted to share info w AGENC[facility]: [employer]        Emotional Assessment   Attitude/Demeanor/Rapport: Self-Confident, Engaged Affect (typically observed): Accepting Orientation: : Oriented to Self, Oriented to Place, Oriented to  Time, Oriented to Situation   Psych Involvement: No (comment)  Admission diagnosis:  Stroke-like symptoms [R29.90] Patient Active Problem List   Diagnosis Date Noted  . Pneumonia due to COVID-19 virus 04/21/2019  . Respiratory failure with hypoxia ([place]) 04/21/2019  . Stroke ([place]) 04/19/2019  . Palpitations 03/18/2013  . PVC (premature ventricular contraction) 03/18/2013  . Smoking history- former heavy smoker 03/18/2013  . Family history of coronary artery disease 03/18/2013  . Obesity (BMI 30-39.9) 03/18/2013   PCP:  Burnard Bunting,  MD Pharmacy:   [address], [place] [address] Phone: [phone] Fax: [place], [place] - [place] [phone] [place] [address] Phone: [phone] Fax: [phone]     Social Determinants of Health (SDOH) Interventions    Readmission Risk Interventions No flowsheet data found.

## 2019-04-23 DIAGNOSIS — I6340 Cerebral infarction due to embolism of unspecified cerebral artery: Secondary | ICD-10-CM | POA: Diagnosis not present

## 2019-04-23 LAB — FERRITIN: Ferritin: 751 ng/mL — ABNORMAL HIGH (ref 24–336)

## 2019-04-23 LAB — C-REACTIVE PROTEIN: CRP: 1.6 mg/dL — ABNORMAL HIGH (ref <1.0)

## 2019-04-23 MED ORDER — INFLUENZA VAC A&B SA ADJ QUAD 0.5 ML IM PRSY
0.5000 mL | PREFILLED_SYRINGE | Freq: Once | INTRAMUSCULAR | Status: AC
  Administered 2019-04-23: 12:00:00 0.5 mL via INTRAMUSCULAR
  Filled 2019-04-23: qty 0.5

## 2019-04-23 MED ORDER — DEXAMETHASONE 6 MG PO TABS: 6.0000 mg | ORAL_TABLET | Freq: Every day | ORAL | 0 refills | Status: AC

## 2019-04-23 MED ORDER — INFLUENZA VAC A&B SA ADJ QUAD 0.5 ML IM PRSY: 0.5000 mL | PREFILLED_SYRINGE | INTRAMUSCULAR | Status: DC

## 2019-04-23 MED ORDER — ATORVASTATIN CALCIUM 40 MG PO TABS: 40.0000 mg | ORAL_TABLET | Freq: Every day | ORAL | 0 refills | Status: DC

## 2019-04-23 MED ORDER — ZINC SULFATE 220 (50 ZN) MG PO CAPS: 220.0000 mg | ORAL_CAPSULE | Freq: Every day | ORAL | Status: AC

## 2019-04-23 MED ORDER — ACETAMINOPHEN 325 MG PO TABS: 650.0000 mg | ORAL_TABLET | Freq: Four times a day (QID) | ORAL | Status: AC | PRN

## 2019-04-23 MED ORDER — ASCORBIC ACID 1000 MG PO TABS: 1000.0000 mg | ORAL_TABLET | Freq: Every day | ORAL | Status: AC

## 2019-04-23 MED ORDER — ALBUTEROL SULFATE HFA 108 (90 BASE) MCG/ACT IN AERS: 2.0000 | INHALATION_SPRAY | Freq: Four times a day (QID) | RESPIRATORY_TRACT | Status: AC | PRN

## 2019-04-23 MED ORDER — APIXABAN 5 MG PO TABS: 5.0000 mg | ORAL_TABLET | Freq: Two times a day (BID) | ORAL | 0 refills | Status: AC

## 2019-04-23 MED FILL — DEXAMETHASONE 2 MG TABLET: 2 | 6 days supply | Qty: 18 | Fill #0

## 2019-04-23 MED FILL — ATORVASTATIN CALCIUM 40 MG: 40 | 30 days supply | Qty: 30 | Fill #0

## 2019-04-23 MED FILL — ELIQUIS 5 MG TABLET: 5 | 30 days supply | Qty: 60 | Fill #0

## 2019-04-23 NOTE — Care Management (Addendum)
CM again text paged [place] SW in regards to recommended DME at discharge  Update:  CM received callback from [place] ordered RW and will have it delivered to pts home.  Pt informed CM that someone in his family has a walker that he can use until his arrives.   CM faxed order for RW to [facility] as requested.  Pt already has a PCP appt on 12/23.  CM informed [facility] that pt will discharge home on [place].  CM will fax requested documents to [phone] at discharge per request.    TOC will deliver discharge medications to pt.  CM faxed documents to [facility]

## 2019-04-23 NOTE — Progress Notes (Signed)
Raymond Camacho to be discharged Home per MD order. Discussed prescriptions and follow up appointments with the patient. Prescriptions given to patient; medication list explained in detail. Patient verbalized understanding.   Skin clean, dry and intact without evidence of skin break down, no evidence of skin tears noted. IV catheter discontinued intact. Site without signs and symptoms of complications. Dressing and pressure applied. Pt denies pain at the site currently. No complaints noted.  Patient free of lines, drains, and wounds.  An After Visit Summary (AVS) was printed and given to the patient. Patient escorted via wheelchair, and discharged home via private auto.  Cathlean Marseilles Tamala Julian, MSN, RN, [place], AGCNS

## 2019-04-23 NOTE — Progress Notes (Signed)
Physical Therapy Treatment Patient Details Name: Raymond Camacho MRN: [id number] DOB: 01/20/1941 Today's Date: 04/23/2019    History of Present Illness 78 year old with PMHx including PVC's who presented with acute onset left-sided weakness that started the afternoon on the day prior to admission.  Of note Patient was evaluated at [facility] where he was tested positive for Covid on 11/27, he was having fevers, and weakness. MRI showed scattered small foci of acute ischemia within the right MCA, and the precentral gyrus.  Normal MRA of head and neck.     PT Comments    Pt making good progress towards his goals today. Pt still has mild L sided weakness with fatigue but has improved since last session. Pt is currently mod I for bed mobility and transfers and min guard for ambulation of 250 feet with RW and ascent/descent of 1 step x4. Pt able to improve SaO2 on RA with use of incentive spirometer. Pt hopeful for d/c home today and will need no additional PT services at that time.    Follow Up Recommendations  No PT follow up;Supervision for mobility/OOB     Equipment Recommendations  Rolling walker with 5" wheels    Recommendations for Other Services       Precautions / Restrictions Precautions Precautions: Fall Restrictions Weight Bearing Restrictions: No    Mobility  Bed Mobility Overal bed mobility: Modified Independent             General bed mobility comments: HOB elevated  Transfers Overall transfer level: Needs assistance Equipment used: None Transfers: Sit to/from Stand Sit to Stand: Modified independent (Device/Increase time)         General transfer comment: for lines and safety  Ambulation/Gait Ambulation/Gait assistance: Min guard Gait Distance (Feet): 250 Feet Assistive device: Rolling walker (2 wheeled) Gait Pattern/deviations: Step-through pattern;Decreased dorsiflexion - left Gait velocity: slowed Gait velocity interpretation: 1.31 - 2.62  ft/sec, indicative of limited community ambulator General Gait Details: min guard for safety and for management of lines, overall steady gait, decreased hip flexion with fatigue causing compensatory knee flexion and mild toe drag   Stairs Stairs: Yes Stairs assistance: Min guard Stair Management: With walker;Forwards Number of Stairs: 1 General stair comments: able to use single step x4 with min guard, vc for sequencing   Wheelchair Mobility    Modified Rankin (Stroke Patients Only) Modified Rankin (Stroke Patients Only) Pre-Morbid Rankin Score: No symptoms Modified Rankin: No significant disability     Balance Overall balance assessment: Needs assistance Sitting-balance support: Feet supported Sitting balance-Leahy Scale: Good     Standing balance support: Bilateral upper extremity supported;No upper extremity supported Standing balance-Leahy Scale: Fair Standing balance comment: improved stability with bil UE support                            Cognition Arousal/Alertness: Awake/alert Behavior During Therapy: WFL for tasks assessed/performed Overall Cognitive Status: Within Functional Limits for tasks assessed                                 General Comments: reports some mental fogginess from [place] like he should have called EMS when he had stroke symptoms but went to bed instead although reports it is clearing as he was able to due his daily [place] for the first time in weeks      Exercises Other Exercises Other Exercises: incentive spirometer x  10    General Comments General comments (skin integrity, edema, etc.): pt on 1 L O2 on entry with SaO2 92%O2, removed [place] and pt able to maintain SaO2 >91%O2 throughout ambulation, able to improve SaO2 to 100%on RA with use of incentive spirometer      Pertinent Vitals/Pain Pain Assessment: No/denies pain           PT Goals (current goals can now be found in the care plan section) Acute Rehab  PT Goals Patient Stated Goal: home when able PT Goal Formulation: With patient Time For Goal Achievement: 05/05/19 Potential to Achieve Goals: Good    Frequency    Min 3X/week      PT Plan Current plan remains appropriate       AM-PAC PT "6 Clicks" Mobility   Outcome Measure  Help needed turning from your back to your side while in a flat bed without using bedrails?: None Help needed moving from lying on your back to sitting on the side of a flat bed without using bedrails?: None Help needed moving to and from a bed to a chair (including a wheelchair)?: None Help needed standing up from a chair using your arms (e.g., wheelchair or bedside chair)?: None Help needed to walk in hospital room?: A Little Help needed climbing 3-5 steps with a railing? : A Lot 6 Click Score: 21    End of Session Equipment Utilized During Treatment: Gait belt Activity Tolerance: Patient tolerated treatment well Patient left: in chair;with call bell/phone within reach Nurse Communication: Mobility status PT Visit Diagnosis: Unsteadiness on feet (R26.81);Other abnormalities of gait and mobility (R26.89);History of falling (Z91.81);Difficulty in walking, not elsewhere classified (R26.2);Other symptoms and signs involving the nervous system [id number])     Time: [id number] PT Time Calculation (min) (ACUTE ONLY): 43 min  Charges:  $Gait Training: 23-37 mins $Therapeutic Exercise: 8-22 mins                     Franca Stakes B. Migdalia Dk PT, DPT Acute Rehabilitation Services Pager [phone] Office [phone]    [place] 04/23/2019, 2:12 PM

## 2019-04-23 NOTE — Discharge Summary (Signed)
Physician Discharge Summary  Raymond Camacho [id number] DOB: 01/16/1941 DOA: 04/19/2019  PCP: Burnard Bunting, MD  Admit date: 04/19/2019 Discharge date: 04/23/2019  Admitted From: Home Disposition: Home  Recommendations for Outpatient Follow-up:  1. Follow up with PCP in 1 week 2. Follow-up with neurology in 4 weeks 3. Isolation until 12/15 4. Please obtain BMP/CBC in one week 5. Please follow up on the following pending results: None  Home Health: None Equipment/Devices: Rolling walker  Discharge Condition: Stable CODE STATUS: Full code Diet recommendation: Heart healthy  Brief/Interim Summary:  Admission HPI written by Donne Hazel, MD   Chief Complaint: L sided weakness  HPI: Raymond Camacho is a 78 y.o. male with medical history significant of PVC's presenting with acute onset L sided weakness that started the afternoon on the day prior to admit. Pt recalled first noticing difficulty holding utensils with his left hand and later in the day progressing to LLE feeling "like Jell-O." Symptoms did not resolve and pt subsequently presented to ED for further work up.   Of note pt has a hx of feeling "like run over by a truck" and fever as high as 101F over the past week. Pt reports occasional cough worse when speaking. Pt was later seen at the [place] where he was reportedly tested positive for COVID on 11/27.  ED Course: In the ED, pt noted to have unremarkable head CT. Neurology was consulted by EDP with recommendations for MRI, currently pending. COVID testing in ED pending. CXR notable for bilateral patchy peripheral interstitial opacities compatible with COVID. Hospitalist consulted for consideration for admission.   Hospital course:  Acute stroke Patient with left sided weakness on presentation. MRI significant for evidence of scattered small foci of acute ischemia within right MCA territor. LDL of 83. Transthoracic Echocardiogram significant for no embolic  source. Neurology consulted with recommendations for Eliquis x3 months followed by aspirin 81 mg. Concern for etiology from hypercoagulable state in setting of COVID-19 infection.  Patient also started on Lipitor states that he has had intolerance to statins in the past.  COVID-19 pneumonia Patient positive from 11/24.  Patient with a peak CRP of 12.6 which improved to 1.6 prior to discharge.  Patient was treated with remdesivir for 5 days in addition to Decadron.  He will continue to complete a 10-day course of Decadron.   Acute respiratory failure with hypoxia Secondary to COVID-19 pneumonia. Patient weaned to room air.  Ambulated without hypoxia and without assistance.  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 Continue Flecainide  AKI Unknown baseline. Creatinine peaked at 1.32. Trended down. Outpatient follow-up.  Discharge Diagnoses:  Active Problems:   PVC (premature ventricular contraction)   Obesity (BMI 30-39.9)   Stroke ([place])   Pneumonia due to COVID-19 virus   Respiratory failure with hypoxia [facility])    Discharge Instructions  Discharge Instructions    Call MD for:  difficulty breathing, headache or visual disturbances   Complete by: As directed    Call MD for:  extreme fatigue   Complete by: As directed    Call MD for:  persistant dizziness or light-headedness   Complete by: As directed    Call MD for:  temperature >100.4   Complete by: As directed    Diet - low sodium heart healthy   Complete by: As directed    Increase activity slowly   Complete by: As directed      Allergies as of 04/23/2019  Reactions   Sulfa Antibiotics Nausea And Vomiting      Medication List    STOP taking these medications   aspirin EC 81 MG tablet   ibuprofen 200 MG tablet Commonly known as: ADVIL     TAKE these medications   acetaminophen 325 MG tablet Commonly known as: TYLENOL Take 2 tablets (650 mg total) by mouth every 6  (six) hours as needed for fever or headache (pain). What changed:   how much to take  when to take this   albuterol 108 (90 Base) MCG/ACT inhaler Commonly known as: VENTOLIN HFA Inhale 2 puffs into the lungs every 6 (six) hours as needed for wheezing or shortness of breath.   apixaban 5 MG Tabs tablet Commonly known as: ELIQUIS Take 1 tablet (5 mg total) by mouth 2 (two) times daily.   ascorbic acid 1000 MG tablet Commonly known as: VITAMIN C Take 1 tablet (1,000 mg total) by mouth daily. Start taking on: April 24, 2019   atorvastatin 40 MG tablet Commonly known as: LIPITOR Take 1 tablet (40 mg total) by mouth daily at 6 PM.   dexamethasone 6 MG tablet Commonly known as: DECADRON Take 1 tablet (6 mg total) by mouth daily for 6 days.   flecainide 50 MG tablet Commonly known as: TAMBOCOR Take 50 mg by mouth 2 (two) times daily.   Krill Oil 500 MG Caps Take 500 mg by mouth daily.   VICKS SINEX NA Place 1 spray into both nostrils daily as needed (congestion/shortness of breath).   zinc sulfate 220 (50 Zn) MG capsule Take 1 capsule (220 mg total) by mouth daily. Start taking on: April 24, 2019            Durable Medical Equipment  (From admission, onward)         Start     Ordered   04/22/19 1135  For home use only DME Walker rolling  Once    Question:  Patient needs a walker to treat with the following condition  Answer:  Mobility impaired   04/22/19 1135         Follow-up Information    Burnard Bunting, MD. Schedule an appointment as soon as possible for a visit in 1 week(s).   Specialty: Internal Medicine Contact information: [place] [address] [phone]        Garvin Fila, MD. Schedule an appointment as soon as possible for a visit in 1 week(s).   Specialties: Neurology, Radiology Why: Stroke Contact information: [address] [place] [address] [phone]          Allergies  Allergen  Reactions   Sulfa Antibiotics Nausea And Vomiting    Consultations:  Neurology   Procedures/Studies: CT HEAD WO CONTRAST  Result Date: 04/19/2019 CLINICAL DATA:  Possible stroke, onset of lightheadedness yesterday at 1600 hours, diagnosed with COVID-19 1 week ago, former smoker EXAM: CT HEAD WITHOUT CONTRAST TECHNIQUE: Contiguous axial images were obtained from the base of the skull through the vertex without intravenous contrast. Sagittal and coronal MPR images reconstructed from axial data set. COMPARISON:  MR head 06/25/2006 FINDINGS: Brain: Normal ventricular morphology. No midline shift or mass effect. Normal appearance of brain parenchyma. No intracranial hemorrhage, mass lesion or evidence of acute infarction. No extra-axial fluid collections. Vascular: No hyperdense vessels Skull: Intact Sinuses/Orbits: Clear Other: N/A IMPRESSION: Normal exam. Electronically Signed   By: Lavonia Dana M.D.   On: 04/19/2019 15:19   MR ANGIO HEAD  WO CONTRAST  Result Date: 04/19/2019 CLINICAL DATA:  Left hand weakness and ataxia. EXAM: MRI HEAD WITHOUT CONTRAST MRA HEAD WITHOUT CONTRAST MRA NECK WITHOUT CONTRAST TECHNIQUE: Multiplanar, multiecho pulse sequences of the brain and surrounding structures were obtained without intravenous contrast. Angiographic images of the Circle of Willis were obtained using MRA technique without intravenous contrast. Angiographic images of the neck were obtained using MRA technique without intravenous contrast. Carotid stenosis measurements (when applicable) are obtained utilizing NASCET criteria, using the distal internal carotid diameter as the denominator. The patient declined the administration of intravenous contrast agent. COMPARISON:  Brain MRI 06/25/2006 FINDINGS: MRI HEAD FINDINGS BRAIN: The midline structures are normal. There are scattered punctate foci of abnormal diffusion restriction within the right MCA territory, including along the precentral gyrus. There is no  hemorrhage or mass effect. Minimal white matter hyperintensity, nonspecific and commonly seen in asymptomatic patients of this age. The CSF spaces are normal for age, with no hydrocephalus. Blood-sensitive sequences show no chronic microhemorrhage or superficial siderosis. SKULL AND UPPER CERVICAL SPINE: The visualized skull base, calvarium, upper cervical spine and extracranial soft tissues are normal. SINUSES/ORBITS: No fluid levels or advanced mucosal thickening. No mastoid or middle ear effusion. The orbits are normal. MRA HEAD FINDINGS POSTERIOR CIRCULATION: --Basilar artery: Normal. --Posterior cerebral arteries: Normal. Both originate from the basilar artery. --Superior cerebellar arteries: Normal. --Inferior cerebellar arteries: Normal anterior and posterior inferior cerebellar arteries. ANTERIOR CIRCULATION: --Intracranial internal carotid arteries: Normal. --Anterior cerebral arteries: Normal. Both A1 segments are present. Patent anterior communicating artery. --Middle cerebral arteries: Normal. --Posterior communicating arteries: Diminutive or absent MRA NECK FINDINGS Aortic arch: Normal 3 vessel aortic branching pattern. The visualized subclavian arteries are normal. Right carotid system: Normal course and caliber without stenosis or evidence of dissection. Left carotid system: Normal course and caliber without stenosis or evidence of dissection. Vertebral arteries: Left dominant. Vertebral artery origins are normal on the left, inadequately visualized on the right. The right vertebral artery is diffusely diminutive. Vertebral arteries are normal in course and caliber to the vertebrobasilar confluence without stenosis or evidence of dissection. IMPRESSION: 1. Scattered small foci of acute ischemia within the right MCA territory, including along the precentral gyrus. No hemorrhage or mass effect. 2. Normal MRA of the head and neck. Electronically Signed   By: Ulyses Jarred M.D.   On: 04/19/2019 19:15    MR ANGIO NECK WO CONTRAST  Result Date: 04/19/2019 CLINICAL DATA:  Left hand weakness and ataxia. EXAM: MRI HEAD WITHOUT CONTRAST MRA HEAD WITHOUT CONTRAST MRA NECK WITHOUT CONTRAST TECHNIQUE: Multiplanar, multiecho pulse sequences of the brain and surrounding structures were obtained without intravenous contrast. Angiographic images of the Circle of Willis were obtained using MRA technique without intravenous contrast. Angiographic images of the neck were obtained using MRA technique without intravenous contrast. Carotid stenosis measurements (when applicable) are obtained utilizing NASCET criteria, using the distal internal carotid diameter as the denominator. The patient declined the administration of intravenous contrast agent. COMPARISON:  Brain MRI 06/25/2006 FINDINGS: MRI HEAD FINDINGS BRAIN: The midline structures are normal. There are scattered punctate foci of abnormal diffusion restriction within the right MCA territory, including along the precentral gyrus. There is no hemorrhage or mass effect. Minimal white matter hyperintensity, nonspecific and commonly seen in asymptomatic patients of this age. The CSF spaces are normal for age, with no hydrocephalus. Blood-sensitive sequences show no chronic microhemorrhage or superficial siderosis. SKULL AND UPPER CERVICAL SPINE: The visualized skull base, calvarium, upper cervical spine and extracranial soft tissues are  normal. SINUSES/ORBITS: No fluid levels or advanced mucosal thickening. No mastoid or middle ear effusion. The orbits are normal. MRA HEAD FINDINGS POSTERIOR CIRCULATION: --Basilar artery: Normal. --Posterior cerebral arteries: Normal. Both originate from the basilar artery. --Superior cerebellar arteries: Normal. --Inferior cerebellar arteries: Normal anterior and posterior inferior cerebellar arteries. ANTERIOR CIRCULATION: --Intracranial internal carotid arteries: Normal. --Anterior cerebral arteries: Normal. Both A1 segments are present.  Patent anterior communicating artery. --Middle cerebral arteries: Normal. --Posterior communicating arteries: Diminutive or absent MRA NECK FINDINGS Aortic arch: Normal 3 vessel aortic branching pattern. The visualized subclavian arteries are normal. Right carotid system: Normal course and caliber without stenosis or evidence of dissection. Left carotid system: Normal course and caliber without stenosis or evidence of dissection. Vertebral arteries: Left dominant. Vertebral artery origins are normal on the left, inadequately visualized on the right. The right vertebral artery is diffusely diminutive. Vertebral arteries are normal in course and caliber to the vertebrobasilar confluence without stenosis or evidence of dissection. IMPRESSION: 1. Scattered small foci of acute ischemia within the right MCA territory, including along the precentral gyrus. No hemorrhage or mass effect. 2. Normal MRA of the head and neck. Electronically Signed   By: Ulyses Jarred M.D.   On: 04/19/2019 19:15   MR BRAIN WO CONTRAST  Result Date: 04/19/2019 CLINICAL DATA:  Left hand weakness and ataxia. EXAM: MRI HEAD WITHOUT CONTRAST MRA HEAD WITHOUT CONTRAST MRA NECK WITHOUT CONTRAST TECHNIQUE: Multiplanar, multiecho pulse sequences of the brain and surrounding structures were obtained without intravenous contrast. Angiographic images of the Circle of Willis were obtained using MRA technique without intravenous contrast. Angiographic images of the neck were obtained using MRA technique without intravenous contrast. Carotid stenosis measurements (when applicable) are obtained utilizing NASCET criteria, using the distal internal carotid diameter as the denominator. The patient declined the administration of intravenous contrast agent. COMPARISON:  Brain MRI 06/25/2006 FINDINGS: MRI HEAD FINDINGS BRAIN: The midline structures are normal. There are scattered punctate foci of abnormal diffusion restriction within the right MCA territory,  including along the precentral gyrus. There is no hemorrhage or mass effect. Minimal white matter hyperintensity, nonspecific and commonly seen in asymptomatic patients of this age. The CSF spaces are normal for age, with no hydrocephalus. Blood-sensitive sequences show no chronic microhemorrhage or superficial siderosis. SKULL AND UPPER CERVICAL SPINE: The visualized skull base, calvarium, upper cervical spine and extracranial soft tissues are normal. SINUSES/ORBITS: No fluid levels or advanced mucosal thickening. No mastoid or middle ear effusion. The orbits are normal. MRA HEAD FINDINGS POSTERIOR CIRCULATION: --Basilar artery: Normal. --Posterior cerebral arteries: Normal. Both originate from the basilar artery. --Superior cerebellar arteries: Normal. --Inferior cerebellar arteries: Normal anterior and posterior inferior cerebellar arteries. ANTERIOR CIRCULATION: --Intracranial internal carotid arteries: Normal. --Anterior cerebral arteries: Normal. Both A1 segments are present. Patent anterior communicating artery. --Middle cerebral arteries: Normal. --Posterior communicating arteries: Diminutive or absent MRA NECK FINDINGS Aortic arch: Normal 3 vessel aortic branching pattern. The visualized subclavian arteries are normal. Right carotid system: Normal course and caliber without stenosis or evidence of dissection. Left carotid system: Normal course and caliber without stenosis or evidence of dissection. Vertebral arteries: Left dominant. Vertebral artery origins are normal on the left, inadequately visualized on the right. The right vertebral artery is diffusely diminutive. Vertebral arteries are normal in course and caliber to the vertebrobasilar confluence without stenosis or evidence of dissection. IMPRESSION: 1. Scattered small foci of acute ischemia within the right MCA territory, including along the precentral gyrus. No hemorrhage or mass effect. 2. Normal MRA  of the head and neck. Electronically Signed    By: Ulyses Jarred M.D.   On: 04/19/2019 19:15   DG Chest Portable 1 View  Result Date: 04/19/2019 CLINICAL DATA:  Shortness of breath EXAM: PORTABLE CHEST 1 VIEW COMPARISON:  None. FINDINGS: The heart size and mediastinal contours are within normal limits accounting for technique. Moderate patchy bilateral peripheral predominant interstitial and airspace opacities are noted. There is no pleural effusion or pneumothorax. The visualized skeletal structures are unremarkable. IMPRESSION: 1. Moderate patchy bilateral peripheral predominant interstitial and airspace opacities compatible with COVID-19 pneumonia. Electronically Signed   By: Zerita Boers M.D.   On: 04/19/2019 17:25   ECHOCARDIOGRAM COMPLETE  Result Date: 04/20/2019   ECHOCARDIOGRAM REPORT   Patient Name:   Raymond Camacho Date of Exam: 04/20/2019 Medical Rec #:  [id number]        Height:       71.0 in Accession #:    [id number]       Weight:       223.0 lb Date of Birth:  1940/07/22        BSA:          2.21 m Patient Age:    47 years         BP:           146/79 mmHg Patient Gender: M                HR:           94 bpm. Exam Location:  Inpatient Procedure: 2D Echo, Color Doppler and Cardiac Doppler Indications:    Stroke  History:        Patient has no prior history of Echocardiogram examinations.                 Covid + 04/10/19.  Sonographer:    Raquel Sarna Senior RDCS Referring Phys: [place]  1. Left ventricular ejection fraction, by visual estimation, is 60 to 65%. The left ventricle has normal function. There is no left ventricular hypertrophy.  2. Left ventricular diastolic parameters are indeterminate.  3. The left ventricle has no regional wall motion abnormalities.  4. Global right ventricle has normal systolic function.The right ventricular size is normal. No increase in right ventricular wall thickness.  5. Left atrial size was normal.  6. Right atrial size was normal.  7. Presence of pericardial fat pad.  8. The mitral  valve is grossly normal. Trace mitral valve regurgitation.  9. The tricuspid valve is normal in structure. Tricuspid valve regurgitation is trivial. 10. The aortic valve is grossly normal. Aortic valve regurgitation is not visualized. 11. The pulmonic valve was not well visualized. Pulmonic valve regurgitation is not visualized. 12. The ascending aorta was not well visualized. 13. The inferior vena cava is normal in size with greater than 50% respiratory variability, suggesting right atrial pressure of 3 mmHg. FINDINGS  Left Ventricle: Left ventricular ejection fraction, by visual estimation, is 60 to 65%. The left ventricle has normal function. The left ventricle has no regional wall motion abnormalities. There is no left ventricular hypertrophy. Left ventricular diastolic parameters are indeterminate. Right Ventricle: The right ventricular size is normal. No increase in right ventricular wall thickness. Global RV systolic function is has normal systolic function. Left Atrium: Left atrial size was normal in size. Right Atrium: Right atrial size was normal in size Pericardium: There is no evidence of pericardial effusion. Presence of pericardial fat pad. Mitral Valve: The  mitral valve is grossly normal. Trace mitral valve regurgitation. Tricuspid Valve: The tricuspid valve is normal in structure. Tricuspid valve regurgitation is trivial. Aortic Valve: The aortic valve is grossly normal. Aortic valve regurgitation is not visualized. Pulmonic Valve: The pulmonic valve was not well visualized. Pulmonic valve regurgitation is not visualized. Aorta: The ascending aorta was not well visualized. Venous: The inferior vena cava is normal in size with greater than 50% respiratory variability, suggesting right atrial pressure of 3 mmHg. IAS/Shunts: No atrial level shunt detected by color flow Doppler.  LEFT VENTRICLE PLAX 2D LVIDd:         5.25 cm  Diastology LVIDs:         3.18 cm  LV e' lateral:   6.31 cm/s LV PW:          1.13 cm  LV E/e' lateral: 14.8 LV IVS:        1.05 cm  LV e' medial:    6.09 cm/s LVOT diam:     2.00 cm  LV E/e' medial:  15.3 LV SV:         92 ml LV SV Index:   40.42 LVOT Area:     3.14 cm  RIGHT VENTRICLE RV S prime:     13.10 cm/s TAPSE (M-mode): 1.8 cm LEFT ATRIUM             Index       RIGHT ATRIUM           Index LA diam:        4.10 cm 1.86 cm/m  RA Area:     15.60 cm LA Vol (A2C):   46.9 ml 21.24 ml/m RA Volume:   41.70 ml  18.88 ml/m LA Vol (A4C):   35.2 ml 15.94 ml/m LA Biplane Vol: 43.4 ml 19.65 ml/m  AORTIC VALVE LVOT Vmax:   84.50 cm/s LVOT Vmean:  55.500 cm/s LVOT VTI:    0.170 m  AORTA Ao Root diam: 3.00 cm MITRAL VALVE MV Area (PHT): 4.06 cm             SHUNTS MV PHT:        54.23 msec           Systemic VTI:  0.17 m MV Decel Time: 187 msec             Systemic Diam: 2.00 cm MV E velocity: 93.40 cm/s 103 cm/s MV A velocity: 89.30 cm/s 70.3 cm/s MV E/A ratio:  1.05       1.5  Buford Dresser MD Electronically signed by Buford Dresser MD Signature Date/Time: 04/20/2019/6:31:24 PM    Final      12/7: Transthoracic echocardiogram  12/7: Transthoracic Echocardiogram IMPRESSIONS   1. Left ventricular ejection fraction, by visual estimation, is 60 to 65%. The left ventricle has normal function. There is no left ventricular hypertrophy. 2. Left ventricular diastolic parameters are indeterminate. 3. The left ventricle has no regional wall motion abnormalities. 4. Global right ventricle has normal systolic function.The right ventricular size is normal. No increase in right ventricular wall thickness. 5. Left atrial size was normal. 6. Right atrial size was normal. 7. Presence of pericardial fat pad. 8. The mitral valve is grossly normal. Trace mitral valve regurgitation. 9. The tricuspid valve is normal in structure. Tricuspid valve regurgitation is trivial. 10. The aortic valve is grossly normal. Aortic valve regurgitation is not visualized. 11. The  pulmonic valve was not well visualized. Pulmonic valve regurgitation is not visualized. 12. The  ascending aorta was not well visualized. 13. The inferior vena cava is normal in size with greater than 50% respiratory variability, suggesting right atrial pressure of 3 mmHg.   Subjective: No chest pain or dyspnea.  Discharge Exam: Vitals:   04/23/19 0501 04/23/19 0920  BP: (!) 136/50   Pulse: (!) 59 92  Resp: 17 15  Temp: 98.2 F (36.8 C)   SpO2: 96% 97%   Vitals:   04/22/19 2223 04/23/19 0300 04/23/19 0501 04/23/19 0920  BP: 139/78 135/77 (!) 136/50   Pulse: 76 78 (!) 59 92  Resp: (!) 21 (!) 22 17 15   Temp: 98 F (36.7 C)  98.2 F (36.8 C)   TempSrc: Oral  Oral   SpO2: 92% 98% 96% 97%  Weight:   92.8 kg     General: Pt is alert, awake, not in acute distress Cardiovascular: RRR, S1/S2 +, no rubs, no gallops Respiratory: CTA bilaterally, no wheezing, no rhonchi Abdominal: Soft, NT, ND, bowel sounds + Extremities: no edema, no cyanosis    The results of significant diagnostics from this hospitalization (including imaging, microbiology, ancillary and laboratory) are listed below for reference.     Microbiology: Recent Results (from the past 240 hour(s))  SARS CORONAVIRUS 2 (TAT 6-24 HRS) Nasopharyngeal Nasopharyngeal Swab     Status: None   Collection Time: 04/19/19  4:39 PM   Specimen: Nasopharyngeal Swab  Result Value Ref Range Status   SARS Coronavirus 2 NEGATIVE NEGATIVE Final    Comment: (NOTE) SARS-CoV-2 target nucleic acids are NOT DETECTED. The SARS-CoV-2 RNA is generally detectable in upper and lower respiratory specimens during the acute phase of infection. Negative results do not preclude SARS-CoV-2 infection, do not rule out co-infections with other pathogens, and should not be used as the sole basis for treatment or other patient management decisions. Negative results must be combined with clinical observations, patient history, and epidemiological  information. The expected result is Negative. Fact Sheet for Patients: SugarRoll.be Fact Sheet for Healthcare Providers: https://www.woods-mathews.com/ This test is not yet approved or cleared by the [place] FDA and  has been authorized for detection and/or diagnosis of SARS-CoV-2 by FDA under an Emergency Use Authorization (EUA). This EUA will remain  in effect (meaning this test can be used) for the duration of the COVID-19 declaration under Section 56 4(b)(1) of the Act, 21 U.S.C. section 360bbb-3(b)(1), unless the authorization is terminated or revoked sooner. Performed at [facility], [place] [address], [place], [place] [address]      Labs: BNP (last 3 results) No results for input(s): BNP in the last 8760 hours. Basic Metabolic Panel: Recent Labs  Lab 04/19/19 1216 04/19/19 1917 04/20/19 0400 04/21/19 0540 04/22/19 0500  NA 136  --  135 136 136  K 4.4  --  4.9 4.9 4.5  CL 102  --  102 104 102  CO2 22  --  21* 23 24  GLUCOSE 92  --  140* 158* 147*  BUN 31*  --  30* 36* 38*  CREATININE 1.19 1.32* 1.10 1.29* 1.21  CALCIUM 8.8*  --  8.5* 8.8* 9.0   Liver Function Tests: Recent Labs  Lab 04/19/19 1216 04/20/19 0400 04/21/19 0540  AST 52* 55* 37  ALT 74* 81* 78*  ALKPHOS 63 67 58  BILITOT 0.8 0.6 0.4  PROT 6.6 6.8 6.8  ALBUMIN 2.5* 2.5* 2.7*   No results for input(s): LIPASE, AMYLASE in the last 168 hours. No results for input(s): AMMONIA in the  last 168 hours. CBC: Recent Labs  Lab 04/19/19 1216 04/19/19 1917  WBC 9.5 8.9  NEUTROABS 6.5  --   HGB 13.7 13.5  HCT 40.3 39.7  MCV 89.0 89.8  PLT 358 435*   Cardiac Enzymes: No results for input(s): CKTOTAL, CKMB, CKMBINDEX, TROPONINI in the last 168 hours. BNP: Invalid input(s): POCBNP CBG: No results for input(s): GLUCAP in the last 168 hours. D-Dimer No results for input(s): DDIMER in the last 72 hours. Hgb A1c No results for input(s): HGBA1C in  the last 72 hours. Lipid Profile No results for input(s): CHOL, HDL, LDLCALC, TRIG, CHOLHDL, LDLDIRECT in the last 72 hours. Thyroid function studies No results for input(s): TSH, T4TOTAL, T3FREE, THYROIDAB in the last 72 hours.  Invalid input(s): FREET3 Anemia work up Recent Labs    04/22/19 0500 04/23/19 0500  FERRITIN 820* 751*   Urinalysis No results found for: COLORURINE, APPEARANCEUR, LABSPEC, He[place], GLUCOSEU, HGBUR, BILIRUBINUR, KETONESUR, PROTEINUR, UROBILINOGEN, NITRITE, LEUKOCYTESUR Sepsis Labs Invalid input(s): PROCALCITONIN,  WBC,  LACTICIDVEN Microbiology Recent Results (from the past 240 hour(s))  SARS CORONAVIRUS 2 (TAT 6-24 HRS) Nasopharyngeal Nasopharyngeal Swab     Status: None   Collection Time: 04/19/19  4:39 PM   Specimen: Nasopharyngeal Swab  Result Value Ref Range Status   SARS Coronavirus 2 NEGATIVE NEGATIVE Final    Comment: (NOTE) SARS-CoV-2 target nucleic acids are NOT DETECTED. The SARS-CoV-2 RNA is generally detectable in upper and lower respiratory specimens during the acute phase of infection. Negative results do not preclude SARS-CoV-2 infection, do not rule out co-infections with other pathogens, and should not be used as the sole basis for treatment or other patient management decisions. Negative results must be combined with clinical observations, patient history, and epidemiological information. The expected result is Negative. Fact Sheet for Patients: SugarRoll.be Fact Sheet for Healthcare Providers: https://www.woods-mathews.com/ This test is not yet approved or cleared by the [place] FDA and  has been authorized for detection and/or diagnosis of SARS-CoV-2 by FDA under an Emergency Use Authorization (EUA). This EUA will remain  in effect (meaning this test can be used) for the duration of the COVID-19 declaration under Section 56 4(b)(1) of the Act, 21 U.S.C. section 360bbb-3(b)(1),  unless the authorization is terminated or revoked sooner. Performed at [facility], [place] [address] [place],  [address]      Time coordinating discharge: 35 minute  SIGNED:   Cordelia Poche, MD [facility] 04/23/2019, 12:26 PM

## 2019-04-24 ENCOUNTER — Encounter (INDEPENDENT_AMBULATORY_CARE_PROVIDER_SITE_OTHER): Payer: Self-pay

## 2019-04-25 ENCOUNTER — Encounter (INDEPENDENT_AMBULATORY_CARE_PROVIDER_SITE_OTHER): Payer: Self-pay

## 2019-04-26 ENCOUNTER — Encounter (INDEPENDENT_AMBULATORY_CARE_PROVIDER_SITE_OTHER): Payer: Self-pay

## 2019-04-27 ENCOUNTER — Encounter (INDEPENDENT_AMBULATORY_CARE_PROVIDER_SITE_OTHER): Payer: Self-pay

## 2019-04-28 ENCOUNTER — Encounter (INDEPENDENT_AMBULATORY_CARE_PROVIDER_SITE_OTHER): Payer: Self-pay

## 2019-04-29 ENCOUNTER — Encounter (INDEPENDENT_AMBULATORY_CARE_PROVIDER_SITE_OTHER): Payer: Self-pay

## 2019-04-30 ENCOUNTER — Encounter (INDEPENDENT_AMBULATORY_CARE_PROVIDER_SITE_OTHER): Payer: Self-pay

## 2019-04-30 ENCOUNTER — Telehealth: Payer: Self-pay | Admitting: Neurology

## 2019-04-30 ENCOUNTER — Other Ambulatory Visit (HOSPITAL_COMMUNITY): Payer: Self-pay | Admitting: Neurology

## 2019-04-30 MED ORDER — SIMVASTATIN 10 MG PO TABS: 10.0000 mg | ORAL_TABLET | Freq: Every evening | ORAL | 11 refills | Status: AC

## 2019-04-30 MED ORDER — COENZYME Q10 30 MG PO CAPS: 200.0000 mg | ORAL_CAPSULE | Freq: Every day | ORAL | 1 refills | Status: AC

## 2019-04-30 NOTE — Telephone Encounter (Signed)
Dr.SEthi can you review chart. Pt was just discharge 04/23/2019. Pt wants to be off the lipitor. Can you advise thanks

## 2019-04-30 NOTE — Telephone Encounter (Signed)
I returned the patient's call.  He stated that he was having tiredness and muscle aches with Lipitor and he had had a similar experience with this medicine years ago.  He requested a change.  I recommend he switch to simvastatin 10 mg daily and also take co-Q10 200 mg daily in addition to minimize side effects.  I made the prescription change in epic.  He voiced understanding

## 2019-04-30 NOTE — Telephone Encounter (Signed)
Dr. Leonie Man, [place] called in to schedule appointment for Hospital f/u but when I looked at your note I did not see anything about pt needing to f/u so I just want to make sure I do see in the discharge summary they requested that patient f/u with you in 1 week. Please advise. Also patient wants to speak to you about taking him off Lipitor he said he is doing fine bu the meds make him feel bad and he does not want to take it.

## 2019-05-01 ENCOUNTER — Other Ambulatory Visit: Payer: Self-pay | Admitting: *Deleted

## 2019-05-01 ENCOUNTER — Encounter (INDEPENDENT_AMBULATORY_CARE_PROVIDER_SITE_OTHER): Payer: Self-pay

## 2019-05-01 NOTE — Patient Outreach (Signed)
[facility]) Care Management  05/01/2019  JOSEMIGUEL SETTLE 1940/06/05 [id number]  EMMI- stroke  RED ON EMMI ALERT Day # 4 Date: Thursday April 30 2019 10 am  Red Alert Reason: Questions/problems with meds? 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 [place] healthcare [facility] admissions x 1 ED visits x 1  in the last 6 months  Admission 04/19/19 to 12/10 /20 CVA  Outreach attempt #1 successful to the home number   Patient is able to verify HIPAA, DOB and address [place] Management RN reviewed and addressed red alert with patient Mr Doubek requests and prefers the contact number be changed to his mobile number for further EMMI and contacts from [facility] Staff He reports this is his preference as he receives various "robo calls" on his home number  [facility] RN CM changed Epic to indicate his priority/preferred number as his mobile number he verified as [address] discussed a possible call on Sunday from [place] that may continue to the home number  He voiced understanding  [facility] RN CM sent a message to [place] to provide this request update  EMMI:  Mr Declercq confirmed his EMMI answer was correct He reports medication questions/concerns. Reports he had concerns with cholesterol medication ordered lipitor  He voiced concerns also with crestor He called Dr Leonie Man 04/30/19 who changed to zocor 10 mg every evening    Conditions: CVA with left sided weakness, evaluated at [facility] where he was tested positive for Covid on 04/10/19,  PVC, PNA due to covid 19 then tested negative on 04/19/19, obesity, palpitations, smoking hx family hx CAD  04/21/19 positive for covid DME: rolling walker (RW)  Medications: denies concerns with taking medications as prescribed, affording medications, side effects of medications and questions about medications    Appointments: 05/06/19 primary care provider (PCP) appointment, [facility] [place], Windy Fast 06/10/19 Dr Leonie Man  Neurology hospital follow up   Consent: [facility] RN CM reviewed [facility] services with patient. Patient gave verbal consent for services [facility] telephonic RN CM.   Advised patient that there will be further automated EMMI- post discharge calls to assess how the patient is doing following the recent hospitalization Advised the patient that another call may be received from a nurse if any of their responses were abnormal. Patient voiced understanding and was appreciative of f/u call.   Plan: [facility] RN CM will follow up with Mr Kotila in 7-14 days to assess for any further needs and will complete case closure if no needs found  Sy Saintjean L. Lavina Hamman, RN, BSN, [place] Coordinator Office number [phone] Mobile number [phone]  Main [facility] number [phone] Fax number [phone]

## 2019-05-02 ENCOUNTER — Encounter (INDEPENDENT_AMBULATORY_CARE_PROVIDER_SITE_OTHER): Payer: Self-pay

## 2019-05-03 ENCOUNTER — Encounter (INDEPENDENT_AMBULATORY_CARE_PROVIDER_SITE_OTHER): Payer: Self-pay

## 2019-05-04 ENCOUNTER — Encounter (INDEPENDENT_AMBULATORY_CARE_PROVIDER_SITE_OTHER): Payer: Self-pay

## 2019-05-05 ENCOUNTER — Encounter (INDEPENDENT_AMBULATORY_CARE_PROVIDER_SITE_OTHER): Payer: Self-pay

## 2019-05-05 ENCOUNTER — Telehealth: Payer: Self-pay

## 2019-05-05 NOTE — Telephone Encounter (Signed)
Patient advise on weakness per protocol:   IF PATIENT HAS WORSENING WEAKNESS WITH INABILITY TO STAND OR IF PATIENT HAS TO HOLD ON TO SOMETHING TO GET BALANCE, ADVISE PATIENT TO CALL 911 AND SEEK TREATMENT IN ED.   Patient states that he has fatigue but it's not severe. Patient has appointment with provider tomorrow.

## 2019-05-06 ENCOUNTER — Encounter (INDEPENDENT_AMBULATORY_CARE_PROVIDER_SITE_OTHER): Payer: Self-pay

## 2019-05-11 ENCOUNTER — Other Ambulatory Visit: Payer: Self-pay | Admitting: *Deleted

## 2019-05-11 ENCOUNTER — Other Ambulatory Visit: Payer: Self-pay

## 2019-05-11 NOTE — Patient Outreach (Signed)
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 [place] healthcare [facility] admissions x 1 ED visits x 1  in the last 6 months  Admission 04/19/19 to 12/10 /20 CVA  Brief review of EPIC notes since last contact   Outreach attempt #2 successful to the mobile number   Patient is able to verify HIPAA, DOB and address [facility] Management RN reviewed follow reason  EMMI Stroke [facility] denies further Questions about medicines ( Last EMMI red stroke alert)   [facility] is noted to be coughing  He confirms his cough is non productive [facility] reports he followed up with [facility] at the [facility] administration ([place]) on 05/06/19, was evaluated, another chest x ray was completed and he was found to have pneumonia in both lungs. He discussed not having repeat chest xray prior to discharge and reports when he was dx with PNA recently it was only found after a chest xray as he has been reported to have clear lung sounds prior to the x ray  [facility] reports not going outside and not being in contact with any know positive covid person. He reports following all covid precautions to include washing his hands, using sanitizer, waiting 6 feet distance from others and wearing a mask.  He was placed on Doxycycline 100 mg for 10 days  He obtained and started his Rx today   Raymond [facility] has a hx of covid  [facility] RN CM reviewed the covid symptoms with him and he reports a cough, a late evening temperature and fatigue only  Prior to his visit to [facility]acho the fever was only noted in the afternoons around "four or five" pm, ranging from 101.0- 101.5   [facility] RN CM and Raymond [facility] reviewed post covid symptoms He is checking his oxygen saturations and it has been 96-98%  He has started taking his inhaler use every 6 hours versus prn  Raymond Cam[facility] had prescribed 3 more inhalers  that Raymond Camacho is awaiting the arrival on as they were mailed via Raymond [facility]acho assessed further to confirm Raymond Camacho uses gas central heating, does have a small dog but "It is not a lap dog and I usually stay away from him" and has 21+ year old carpet in his home except the basement area. [facility] RN CM discussed pet dander, carpet particles and humidification/purification e voiced understanding    He is to follow up with Raymond Camacho on 06/06/19 and then on 10/04/19 (4 months later) He is able to confirm he is aware of the [facility] walk in clinic and/or to call Raymond  Sherral [facility] office if any symptoms worsen.  He confirms he will monitor and document and noted changes especially related to the other possible triggers he and [facility] RN CM were able to identify today   Conditions: CVA with left sided weakness, evaluated at [facility] where he was tested positive for Covid on 04/10/19,  PVC, PNA due to covid 19 then tested negative on 04/19/19, obesity, palpitations, smoking hx family hx CAD  04/21/19 positive for covid  DME: rolling walker (RW), pulse oximeter  Medications: denies concerns with taking medications as prescribed, affording medications, side effects of medications and questions about medications    Appointments: [facility] on 06/06/19 (1 month f/u) and then on 10/04/19 (4 months later)  06/10/19 [facility]  [facility] follow up    Advised patient that there will be further automated EMMI-post discharge calls to assess how the patient is doing following the recent hospitalization Advised the patient that another call may be received from a nurse if any of their responses were abnormal. Patient voiced understanding and was appreciative of f/u call.  Plans [facility] RN CM will follow up with Raymond Camacho within the next 10-14 business days to follow up on his reported symptoms today  Routed note to MD   [place]. Lavina Hamman, RN, BSN, [place] Coordinator Office number [phone] Mobile number [phone]  Main [facility] number [phone] Fax number [phone]

## 2019-05-22 ENCOUNTER — Other Ambulatory Visit: Payer: Self-pay

## 2019-05-22 ENCOUNTER — Other Ambulatory Visit: Payer: Self-pay | Admitting: *Deleted

## 2019-05-22 NOTE — Patient Outreach (Signed)
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 Mr Marrin reports not preferring to be on "lots of medicines" and only using natural items. He has completed all antibiotics ( Doxycycline 100 mg for 10 days) and decadron. He remains on prn medicines, cholesterol medicine, HTN medicine and Eliquis. He states he will continue Eliquis for 3 months. [facility] RN CM discussed with him that further labs and evaluation by Dr Sherral Hammers may determine if he still needs other medications. He voiced understanding   Mr Litfin is a patient with a past medical history (PMH) of covid and PNA. He reports only residual cough & fatigue that he says are improving and not worsening. He states to prevent "pleurisy" he took tessalon pearls at night for a few days and practiced deep breathing and coughing. He confirms he stopped smoking 40 years ago. He states his elevated temperature in the evenings has resolved and denies any night sweats   He confirms his cough is non productive   Mr Francy reports not going outside and not being in contact with any know positive covid person. He reports following all covid precautions to include washing his hands, using sanitizer, waiting 6 feet distance from others and wearing a mask.    He is to follow up with Dr Sherral Hammers on 06/06/19 and then on  10/04/19 (4 months later) He is able to confirm he is aware of the [facility] walk in clinic and/or to call Dr  Sherral Hammers office if any symptoms worsen.    Conditions:CVAwith left sided weakness,evaluated at [facility] where he was tested positive for Covidon 04/10/19,PVC, PNA due to covid 19then tested negative on 04/19/19, obesity, palpitations, smoking hx (stopped "18 years ago" family hx CAD  04/21/19 positive for covid  [id number] walker (RW), pulse oximeter  Medications:denies concerns with taking medications as prescribed, affording medications, side effects of medications and questions about medications    Appointments: Dr Sherral Hammers on 06/06/19 (1 month f/u) and then on 10/04/19 (4 months later)  06/10/19 Dr Leonie Man Neurology hospital follow up   Advised patient that there will be further automated EMMI-post discharge calls to assess how the patient is doing following the recent hospitalization Advised the patient that another call may be received from a nurse if any of their responses were abnormal. Patient voiced understanding and was appreciative of f/u call.  Plans [facility] RN CM will close case at this time as patient has been assessed and no needs identified/needs resolved. Mr Bolerjack was offered [facility] health coach services but he declined the offer but requested [facility] RN CM send another letter to him as he is unable to find the previous [facility] outreach letter sent to him   Pt encouraged to return a call to [place] CM prn  Routed note to MDs/NP/PA  [place]. Lavina Hamman, RN, BSN, [place] Coordinator Office number [phone]  Mobile number [phone]  Main [facility] number [phone] Fax number [phone]

## 2019-06-10 ENCOUNTER — Inpatient Hospital Stay: Payer: Self-pay | Admitting: Neurology

## 2020-08-28 ENCOUNTER — Encounter (HOSPITAL_BASED_OUTPATIENT_CLINIC_OR_DEPARTMENT_OTHER): Payer: Self-pay

## 2020-08-28 ENCOUNTER — Other Ambulatory Visit: Payer: Self-pay

## 2020-08-28 ENCOUNTER — Emergency Department (HOSPITAL_BASED_OUTPATIENT_CLINIC_OR_DEPARTMENT_OTHER)
Admission: EM | Admit: 2020-08-28 | Discharge: 2020-08-28 | Disposition: A | Discharge status: Home / Self Care | Payer: No Typology Code available for payment source | Attending: Emergency Medicine | Admitting: Emergency Medicine

## 2020-08-28 DIAGNOSIS — R112 Nausea with vomiting, unspecified: Secondary | ICD-10-CM | POA: Diagnosis not present

## 2020-08-28 DIAGNOSIS — Z7901 Long term (current) use of anticoagulants: Secondary | ICD-10-CM | POA: Insufficient documentation

## 2020-08-28 DIAGNOSIS — Z87891 Personal history of nicotine dependence: Secondary | ICD-10-CM | POA: Diagnosis not present

## 2020-08-28 DIAGNOSIS — Z8616 Personal history of COVID-19: Secondary | ICD-10-CM | POA: Insufficient documentation

## 2020-08-28 DIAGNOSIS — R42 Dizziness and giddiness: Secondary | ICD-10-CM

## 2020-08-28 LAB — BASIC METABOLIC PANEL
Anion gap: 10 (ref 5–15)
BUN: 24 mg/dL — ABNORMAL HIGH (ref 8–23)
CO2: 27 mmol/L (ref 22–32)
Calcium: 9.5 mg/dL (ref 8.9–10.3)
Chloride: 102 mmol/L (ref 98–111)
Creatinine, Ser: 1.39 mg/dL — ABNORMAL HIGH (ref 0.61–1.24)
GFR, Estimated: 51 mL/min — ABNORMAL LOW (ref >60)
Glucose, Bld: 144 mg/dL — ABNORMAL HIGH (ref 70–99)
Potassium: 3.4 mmol/L — ABNORMAL LOW (ref 3.5–5.1)
Sodium: 139 mmol/L (ref 135–145)

## 2020-08-28 LAB — CBC WITH DIFFERENTIAL/PLATELET
Abs Immature Granulocytes: 0.02 10*3/uL (ref 0.00–0.07)
Basophils Absolute: 0 10*3/uL (ref 0.0–0.1)
Basophils Relative: 1 %
Eosinophils Absolute: 0.2 10*3/uL (ref 0.0–0.5)
Eosinophils Relative: 2 %
HCT: 43.4 % (ref 39.0–52.0)
Hemoglobin: 14.8 g/dL (ref 13.0–17.0)
Immature Granulocytes: 0 %
Lymphocytes Relative: 44 %
Lymphs Abs: 3.9 10*3/uL (ref 0.7–4.0)
MCH: 30.2 pg (ref 26.0–34.0)
MCHC: 34.1 g/dL (ref 30.0–36.0)
MCV: 88.6 fL (ref 80.0–100.0)
Monocytes Absolute: 0.8 10*3/uL (ref 0.1–1.0)
Monocytes Relative: 10 %
Neutro Abs: 3.6 10*3/uL (ref 1.7–7.7)
Neutrophils Relative %: 43 %
Platelets: 187 10*3/uL (ref 150–400)
RBC: 4.9 MIL/uL (ref 4.22–5.81)
RDW: 13 % (ref 11.5–15.5)
WBC: 8.6 10*3/uL (ref 4.0–10.5)
nRBC: 0 % (ref 0.0–0.2)

## 2020-08-28 MED ORDER — MECLIZINE HCL 25 MG PO TABS
25.0000 mg | ORAL_TABLET | Freq: Once | ORAL | Status: AC
  Administered 2020-08-28: 25 mg via ORAL
  Filled 2020-08-28: qty 1

## 2020-08-28 MED ORDER — MECLIZINE HCL 25 MG PO TABS: 25.0000 mg | ORAL_TABLET | Freq: Three times a day (TID) | ORAL | 0 refills | Status: AC | PRN

## 2020-08-28 MED ORDER — LACTATED RINGERS IV BOLUS
1000.0000 mL | Freq: Once | INTRAVENOUS | Status: AC
  Administered 2020-08-28: 1000 mL via INTRAVENOUS

## 2020-08-28 NOTE — ED Provider Notes (Signed)
Patient signed out pending recheck.  Labs at baseline.  In brief, presented with dizziness and emesis.  Initial history and physical exam consistent with peripheral vertigo.  Patient was given meclizine and fluids.  On my recheck, he reports complete resolution of symptoms.  Per nursing he ambulated without difficulty and without recurrence of symptoms.  Plan for discharge home with meclizine.  Physical Exam  BP (!) 147/82   Pulse 70   Temp 98.6 F (37 C)   Resp 17   Ht 1.803 m (5\' 11" )   Wt 98.6 kg   SpO2 94%   BMI 30.32 kg/m    ED Course/Procedures   Clinical Course as of 08/28/20 2346  Sun Aug 28, 2020  2336 On recheck, patient reports resolution of symptoms with fluids and meclizine.  He states he feels much better.  Will ambulate. [CH]    Clinical Course User Index [CH] Chae Shuster, Barbette Hair, MD    Procedures  MDM   Problem List Items Addressed This Visit   None   Visit Diagnoses    Vertigo    -  Primary           Dina Rich Barbette Hair, MD 08/28/20 [phone]

## 2020-08-28 NOTE — ED Notes (Signed)
After ambulating patient, he did not have any vertigo or nausea.

## 2020-08-28 NOTE — ED Provider Notes (Signed)
[facility] EMERGENCY DEPT Provider Note   CSN: [id number] Arrival date & time: 08/28/20  2212     History Chief Complaint  Patient presents with  . Emesis  . Dizziness    Raymond Camacho is a 80 y.o. male.  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  Patient reports symptoms started 3 days ago.  Patient has been staying on a boat in [place] and woke up on Friday with a sensation of spinning that resulted in him vomiting several times.  He reports it lasted approximately 20 to 30 minutes and then did improve.  He was okay most of the day yesterday and was able to eat without difficulty.  He got back on the boat last night and had another episode of dizziness which he describes as a spinning sensation where everything is going around.  He had several other episodes of vomiting.  Today he was feeling okay he was on his drive home and had to pull over the car because he had an episode of dizziness that he did not have vomiting with.  Again these episodes last between 20 and 30 minutes.  Once he got home he was feeling better and then had another episode.  He was going to wait to see his doctor tomorrow but because he was feeling the nausea again he decided to come and be evaluated.  He was dizzy with getting in the car but reports the dizziness is gone at the moment.  It does seem to be worse with movement or when he tries to walk.  He denies any nausea at this time.  He has had no chest pain, palpitations, shortness of breath, abdominal pain, diarrhea or fever.  No headache or neck pain.  He has had no recent medication changes.  He denies any unilateral numbness, weakness and denies any speech problems.  With his prior stroke he had left-sided weakness and numbness.  He has no lasting deficits from that.  He has chronic tinnitus but has not noticed  any worsening or hearing loss since the episode started.  The history is provided by the patient.  Emesis Dizziness Associated symptoms: vomiting        Past Medical History:  Diagnosis Date  . COVID-19   . Current non-smoker but past smoking history unknown   . PVC's (premature ventricular contractions)     Patient Active Problem List   Diagnosis Date Noted  . Pneumonia due to COVID-19 virus 04/21/2019  . Respiratory failure with hypoxia ([place]) 04/21/2019  . Stroke ([place]) 04/19/2019  . Palpitations 03/18/2013  . PVC (premature ventricular contraction) 03/18/2013  . Smoking history- former heavy smoker 03/18/2013  . Family history of coronary artery disease 03/18/2013  . Obesity (BMI 30-39.9) 03/18/2013    Past Surgical History:  Procedure Laterality Date  . no prior surgery         Family History  Problem Relation Age of Onset  . CAD Father 27  . Colon cancer Neg Hx   . Stomach cancer Neg Hx     Social History   Tobacco Use  . Smoking status: Former Smoker    Quit date: 10/03/1978    Years since quitting: 41.9  . Smokeless tobacco: Never Used  Substance Use Topics  . Alcohol use: Yes    Alcohol/week: 3.0 standard drinks    Types: 3 Cans of  beer per week  . Drug use: No    Home Medications Prior to Admission medications   Medication Sig Start Date End Date Taking? Authorizing Provider  acetaminophen (TYLENOL) 325 MG tablet Take 2 tablets (650 mg total) by mouth every 6 (six) hours as needed for fever or headache (pain). 04/23/19   Mariel Aloe, MD  albuterol (VENTOLIN HFA) 108 (90 Base) MCG/ACT inhaler Inhale 2 puffs into the lungs every 6 (six) hours as needed for wheezing or shortness of breath. 04/23/19   Mariel Aloe, MD  apixaban (ELIQUIS) 5 MG TABS tablet Take 1 tablet (5 mg total) by mouth 2 (two) times daily. 04/23/19   Mariel Aloe, MD  co-enzyme Q-10 30 MG capsule Take 7 capsules (210 mg total) by mouth daily. 04/30/19   Garvin Fila,  MD  flecainide (TAMBOCOR) 50 MG tablet Take 50 mg by mouth 2 (two) times daily.    [provider]  Javier Docker Oil 500 MG CAPS Take 500 mg by mouth daily.    [provider]  Oxymetazoline HCl (VICKS SINEX NA) Place 1 spray into both nostrils daily as needed (congestion/shortness of breath).    [provider]  simvastatin (ZOCOR) 10 MG tablet Take 1 tablet (10 mg total) by mouth every evening. 04/30/19 04/29/20  Garvin Fila, MD  vitamin C (VITAMIN C) 1000 MG tablet Take 1 tablet (1,000 mg total) by mouth daily. 04/24/19   Mariel Aloe, MD  zinc sulfate 220 (50 Zn) MG capsule Take 1 capsule (220 mg total) by mouth daily. 04/24/19   Mariel Aloe, MD    Allergies    Sulfa antibiotics  Review of Systems   Review of Systems  Gastrointestinal: Positive for vomiting.  Neurological: Positive for dizziness.  All other systems reviewed and are negative.   Physical Exam Updated Vital Signs BP (!) 156/85 (BP Location: Right Arm)   Pulse 88   Temp 98.6 F (37 C)   Resp 19   Ht 5\' 11"  (1.803 m)   Wt 98.6 kg   SpO2 98%   BMI 30.32 kg/m   Physical Exam Vitals and nursing note reviewed.  Constitutional:      General: He is not in acute distress.    Appearance: He is well-developed.  HENT:     Head: Normocephalic and atraumatic.     Right Ear: Tympanic membrane normal.     Left Ear: Tympanic membrane normal.     Nose: Nose normal.  Eyes:     General: No visual field deficit.    Extraocular Movements: Extraocular movements intact.     Conjunctiva/sclera: Conjunctivae normal.     Pupils: Pupils are equal, round, and reactive to light.     Comments: No nystagmus noted when looking to the left  Cardiovascular:     Rate and Rhythm: Normal rate and regular rhythm.     Pulses: Normal pulses.     Heart sounds: No murmur heard.   Pulmonary:     Effort: Pulmonary effort is normal. No respiratory distress.     Breath sounds: Normal breath sounds. No  wheezing or rales.  Abdominal:     General: There is no distension.     Palpations: Abdomen is soft.     Tenderness: There is no abdominal tenderness. There is no guarding or rebound.  Musculoskeletal:        General: No tenderness. Normal range of motion.     Cervical back: Normal range of motion  and neck supple. No tenderness. No spinous process tenderness or muscular tenderness.     Right lower leg: No edema.     Left lower leg: No edema.  Skin:    General: Skin is warm and dry.     Findings: No erythema or rash.  Neurological:     General: No focal deficit present.     Mental Status: He is alert and oriented to person, place, and time. Mental status is at baseline.     Cranial Nerves: No dysarthria or facial asymmetry.     Sensory: Sensation is intact. No sensory deficit.     Motor: Motor function is intact. No weakness or pronator drift.     Coordination: Coordination normal. Finger-Nose-Finger Test and Heel to [place] Test normal.     Gait: Gait is intact. Gait normal.  Psychiatric:        Mood and Affect: Mood normal.        Behavior: Behavior normal.        Thought Content: Thought content normal.     ED Results / Procedures / Treatments   Labs (all labs ordered are listed, but only abnormal results are displayed) Labs Reviewed  CBC WITH DIFFERENTIAL/PLATELET  BASIC METABOLIC PANEL    EKG None  Radiology No results found.  Procedures Procedures   Medications Ordered in ED Medications  lactated ringers bolus 1,000 mL (1,000 mLs Intravenous New Bag/Given 08/28/20 2237)  meclizine (ANTIVERT) tablet 25 mg (25 mg Oral Given 08/28/20 2234)    ED Course  I have reviewed the triage vital signs and the nursing notes.  Pertinent labs & imaging results that were available during my care of the patient were reviewed by me and considered in my medical decision making (see chart for details).    MDM Rules/Calculators/A&P                          Pt with sx most  consistent with peripheral vertigo.  No systemic or infectious sx.  Normal neuro exam without weakness, ataxia or cerebellar findings on exam.  Normal vision.  Sx are reproducible with movement of the head and attempting to walk.  VS reassuring today.  Low suspicion for stroke given story and exam.  Will treat for peripheral vertigo and re-eval.   Final Clinical Impression(s) / ED Diagnoses Final diagnoses:  None    Rx / DC Orders ED Discharge Orders    None       Blanchie Dessert, MD 08/28/20 2248

## 2020-08-28 NOTE — Discharge Instructions (Addendum)
You were seen today for dizziness and vomiting.  Your history is consistent with vertigo.  Take medication as needed.  If you develop new or worsening symptoms, strokelike symptoms, you should be reevaluated.

## 2020-08-28 NOTE — ED Triage Notes (Signed)
Pt is present the ED for intermittent dizziness and nausea/vomiting for the last three days. Pt has been on a boat in [place] and has had multiple episodes where he would wake up in a sweat, have a "tingling sensation" on the top of his head and would be dizzy for about 20 minutes. Pt would then proceed to throw up. Denies neck pain or loss of vision.   Hx of tinnitus and stroke two years ago.

## 2021-08-13 IMAGING — CT CT HEAD W/O CM
3 series · 16 of 47 positions shown, 19 images · non-contrast
Comparison: MR head 06/25/2006

CLINICAL DATA: Possible stroke, onset of lightheadedness yesterday
at 8999 hours, diagnosed with [id number] 1 week ago, former smoker

EXAM:
CT HEAD WITHOUT CONTRAST
TECHNIQUE: Contiguous axial images were obtained from the base of the skull
through the vertex without intravenous contrast. Sagittal and
coronal MPR images reconstructed from axial data set.

[Series 3: head 5.0 h30s · axial · 0.50mm/px · z∈[+1115,+1275]mm · 10 of 38 slices shown, 13 images]
[im 3/38  brain]
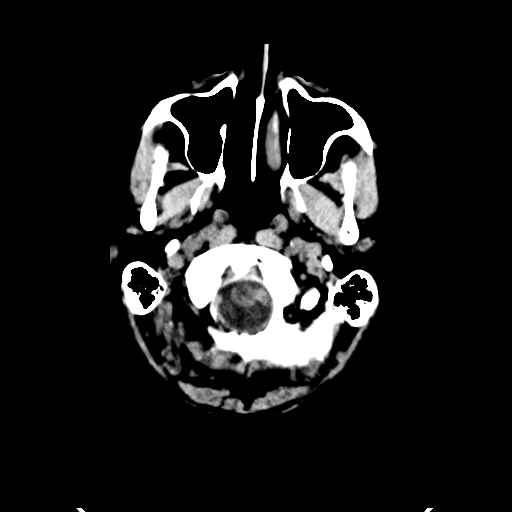
[im 3/38  bone]
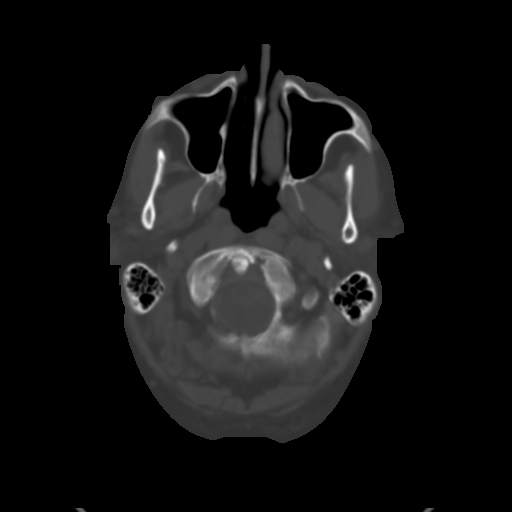
[im 7/38  brain]
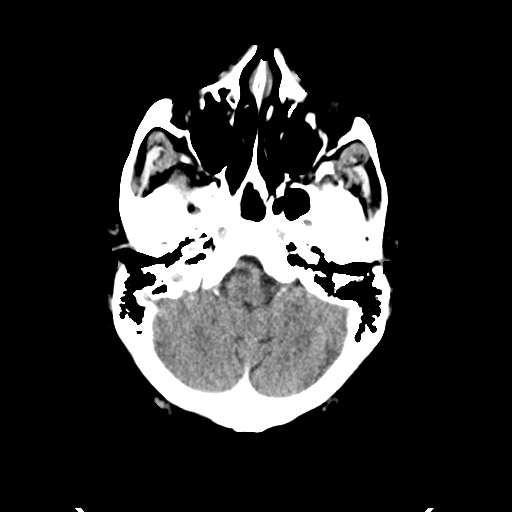
[im 11/38  brain]
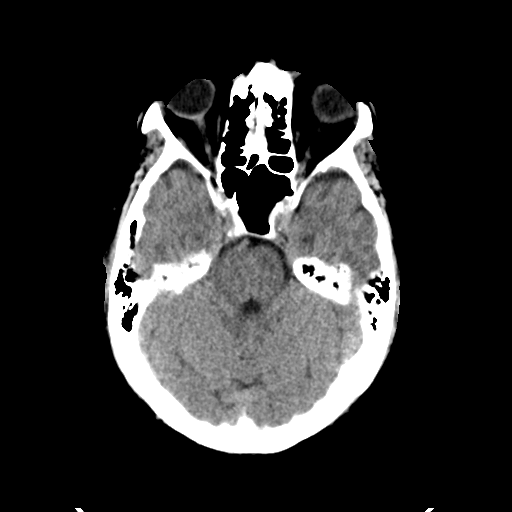
[im 13/38  brain]
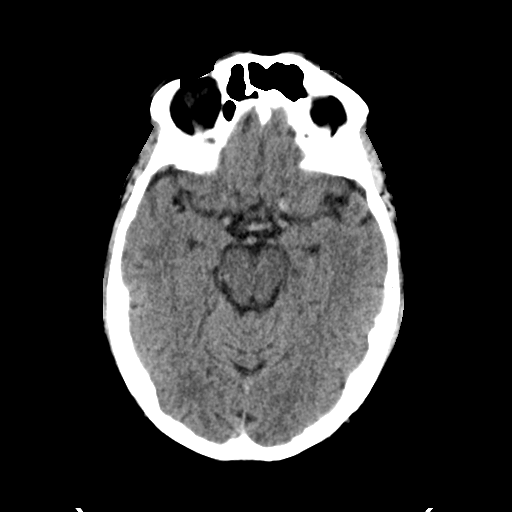
[im 17/38  brain]
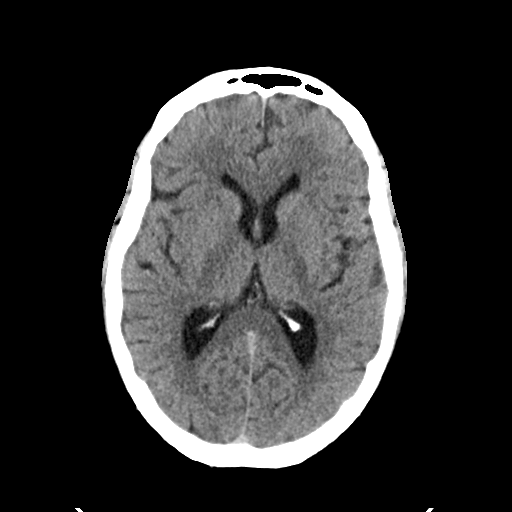
[im 17/38  bone]
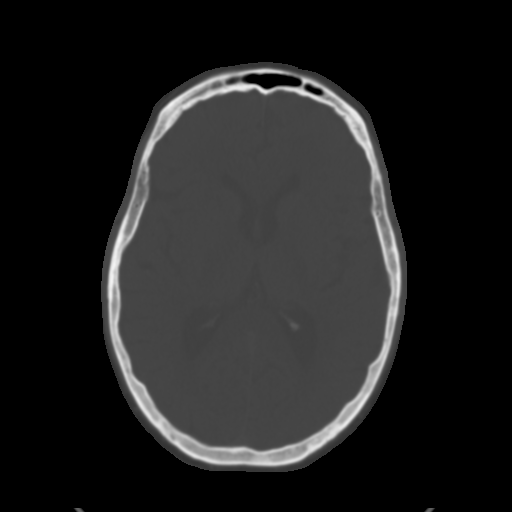
[im 21/38  brain]
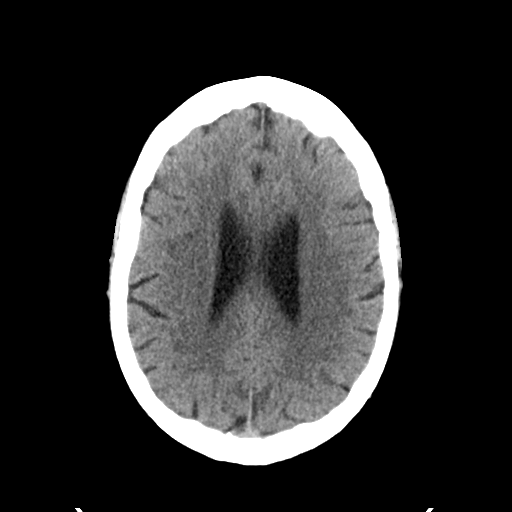
[im 25/38  brain]
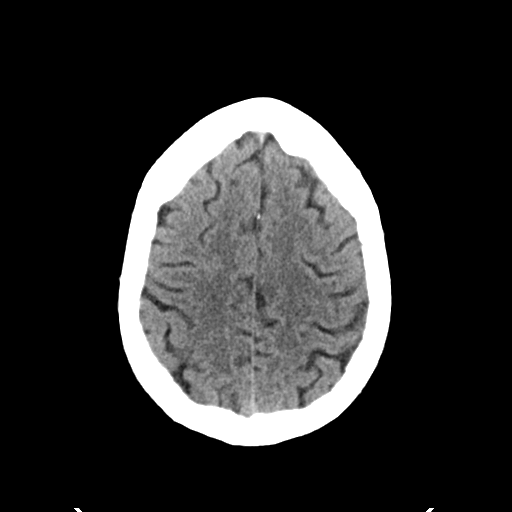
[im 29/38  brain]
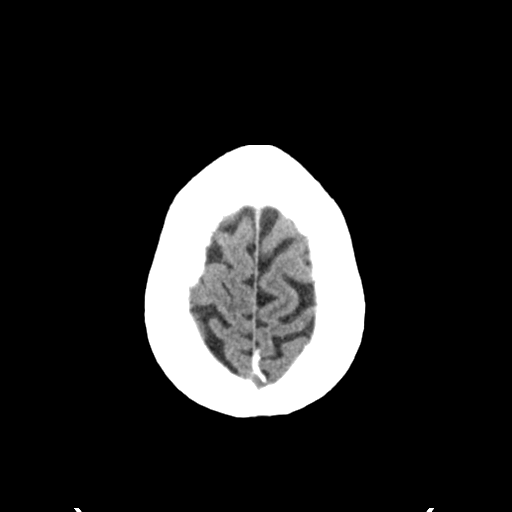
[im 31/38  brain]
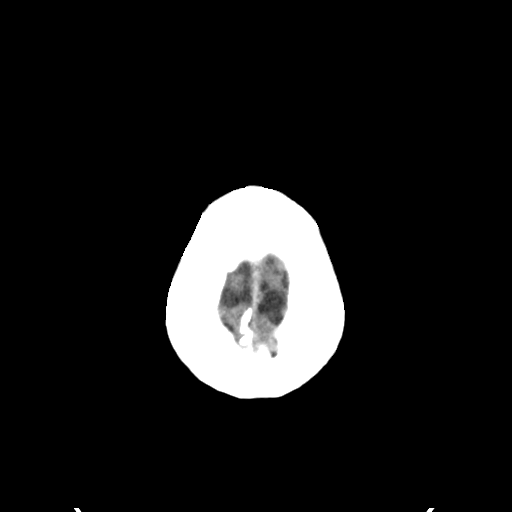
[im 31/38  bone]
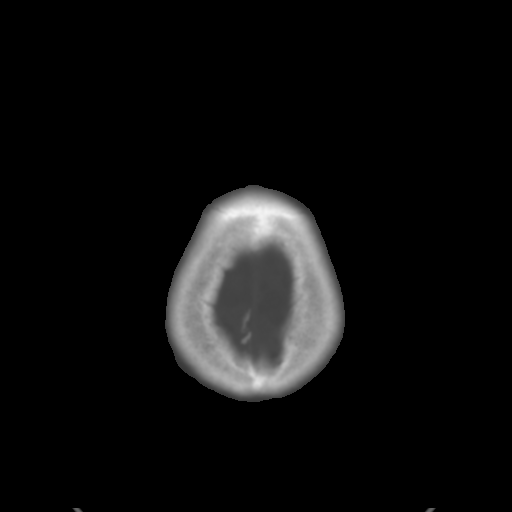
[im 35/38  brain]
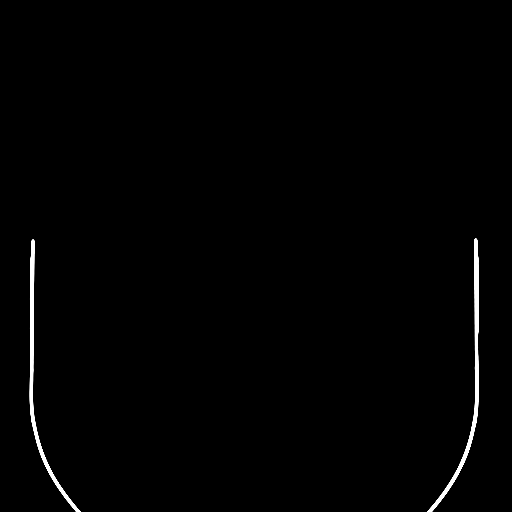

[Series 5: head 3.0 mpr cor · coronal · 0.37mm/px · 3 of 79 slices shown]
[im 27/79  brain]
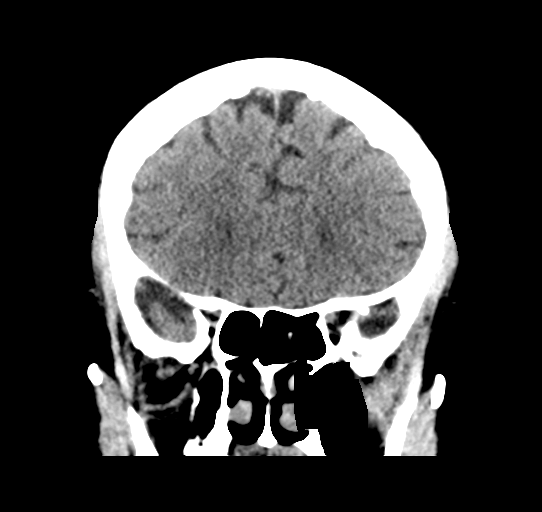
[im 35/79  brain]
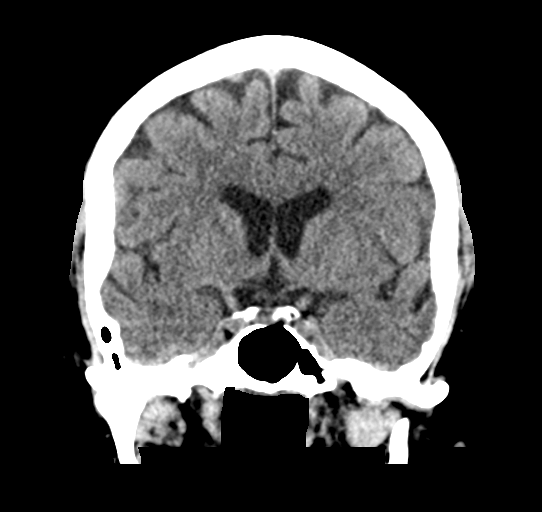
[im 44/79  brain]
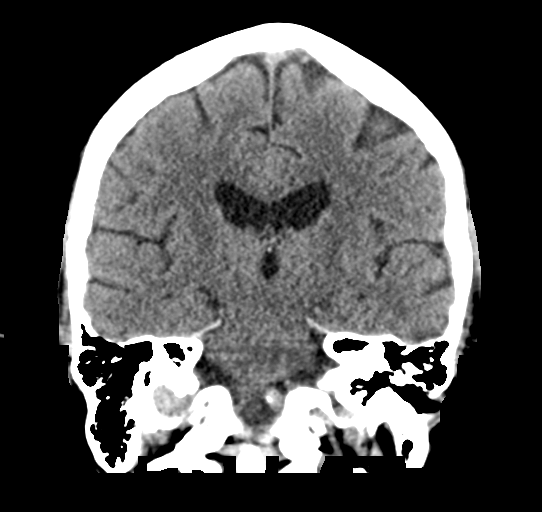

[Series 6: head 3.0 mpr sag · sagittal · 0.39mm/px · 3 of 63 slices shown]
[im 21/63  brain]
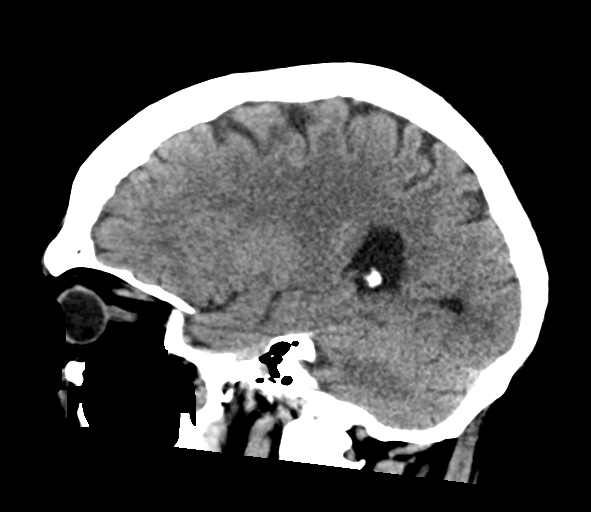
[im 32/63  brain]
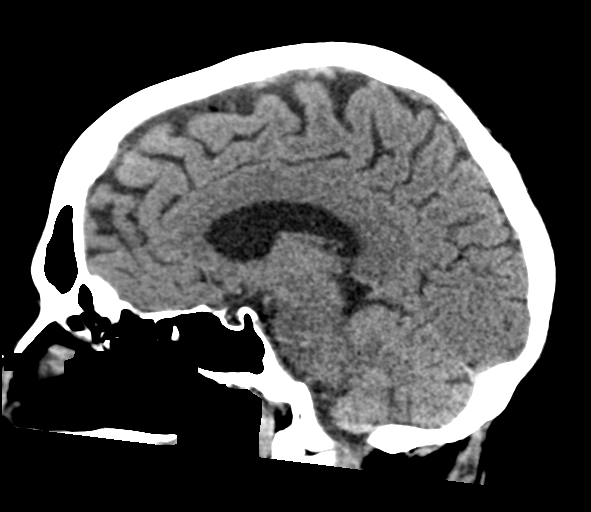
[im 42/63  brain]
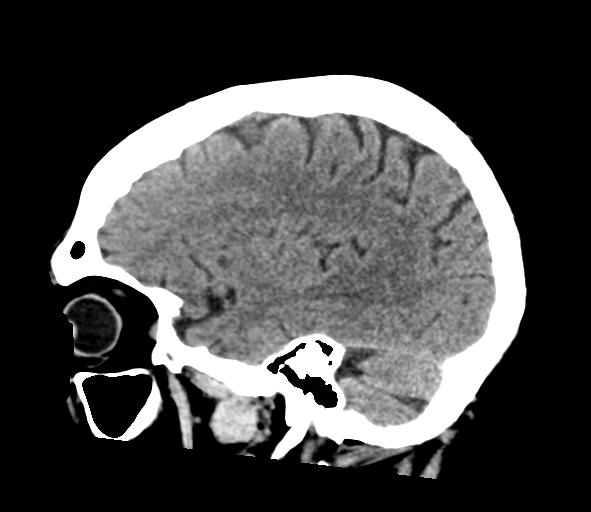

[16 of 47 positions shown; findings below may reference images not displayed]

FINDINGS: Brain: Normal ventricular morphology. No midline shift or mass
effect. Normal appearance of brain parenchyma. No intracranial
hemorrhage, mass lesion or evidence of acute infarction. No
extra-axial fluid collections.

Vascular: No hyperdense vessels

Skull: Intact

Sinuses/Orbits: Clear

Other: N/A
IMPRESSION: Normal exam.

## 2021-10-16 ENCOUNTER — Other Ambulatory Visit: Payer: Self-pay

## 2021-10-16 ENCOUNTER — Encounter (HOSPITAL_BASED_OUTPATIENT_CLINIC_OR_DEPARTMENT_OTHER): Payer: Self-pay

## 2021-10-16 DIAGNOSIS — Z5321 Procedure and treatment not carried out due to patient leaving prior to being seen by health care provider: Secondary | ICD-10-CM | POA: Insufficient documentation

## 2021-10-16 DIAGNOSIS — S8992XA Unspecified injury of left lower leg, initial encounter: Secondary | ICD-10-CM | POA: Diagnosis present

## 2021-10-16 DIAGNOSIS — S61412A Laceration without foreign body of left hand, initial encounter: Secondary | ICD-10-CM | POA: Insufficient documentation

## 2021-10-16 DIAGNOSIS — S81812A Laceration without foreign body, left lower leg, initial encounter: Secondary | ICD-10-CM | POA: Insufficient documentation
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
## 2021-10-17 ENCOUNTER — Emergency Department (HOSPITAL_BASED_OUTPATIENT_CLINIC_OR_DEPARTMENT_OTHER)
Admission: EM | Admit: 2021-10-17 | Discharge: 2021-10-17 | Disposition: A | Discharge status: Home / Self Care | Payer: No Typology Code available for payment source | Attending: Emergency Medicine | Admitting: Emergency Medicine

## 2022-12-23 ENCOUNTER — Emergency Department (HOSPITAL_BASED_OUTPATIENT_CLINIC_OR_DEPARTMENT_OTHER)
Admission: EM | Admit: 2022-12-23 | Discharge: 2022-12-23 | Disposition: A | Discharge status: Home / Self Care | Payer: No Typology Code available for payment source | Attending: Emergency Medicine | Admitting: Emergency Medicine

## 2022-12-23 ENCOUNTER — Emergency Department (HOSPITAL_BASED_OUTPATIENT_CLINIC_OR_DEPARTMENT_OTHER): Payer: No Typology Code available for payment source

## 2022-12-23 ENCOUNTER — Encounter (HOSPITAL_BASED_OUTPATIENT_CLINIC_OR_DEPARTMENT_OTHER): Payer: Self-pay

## 2022-12-23 DIAGNOSIS — W2209XA Striking against other stationary object, initial encounter: Secondary | ICD-10-CM | POA: Insufficient documentation

## 2022-12-23 DIAGNOSIS — S61303A Unspecified open wound of left middle finger with damage to nail, initial encounter: Secondary | ICD-10-CM | POA: Insufficient documentation

## 2022-12-23 DIAGNOSIS — Y9301 Activity, walking, marching and hiking: Secondary | ICD-10-CM | POA: Insufficient documentation

## 2022-12-23 DIAGNOSIS — S6992XA Unspecified injury of left wrist, hand and finger(s), initial encounter: Secondary | ICD-10-CM | POA: Diagnosis present

## 2022-12-23 DIAGNOSIS — Z7901 Long term (current) use of anticoagulants: Secondary | ICD-10-CM | POA: Diagnosis not present

## 2022-12-23 DIAGNOSIS — T148XXA Other injury of unspecified body region, initial encounter: Secondary | ICD-10-CM

## 2022-12-23 MED ORDER — BACITRACIN ZINC 500 UNIT/GM EX OINT: TOPICAL_OINTMENT | Freq: Once | CUTANEOUS | Status: AC

## 2022-12-23 MED ORDER — CEPHALEXIN 500 MG PO CAPS: 500.0000 mg | ORAL_CAPSULE | Freq: Four times a day (QID) | ORAL | 0 refills | Status: AC

## 2022-12-23 NOTE — ED Triage Notes (Signed)
Pt states he hit hand on piece on metal just PTA. States he "thinks" tetanus shot is UTD. Bleeding controlled at time of triage, CNS intact distal to injury

## 2022-12-23 NOTE — ED Provider Notes (Signed)
[place] EMERGENCY DEPARTMENT AT [facility] Provider Note   CSN: [id number] Arrival date & time: 12/23/22  1102     History  Chief Complaint  Patient presents with   Hand Injury    L    [name] is a 82 y.o. male.  Patient to the emergency department today for evaluation of left hand injury and skin avulsion.  Patient states that he was walking by an old vehicle that he was working on.  There is a piece of metal that was sticking out.  He skimmed the back of his hand and struck the piece of metal.  He sustained a skin avulsion overlying the third MCP.  He reports tetanus is up-to-date.  There was significant bleeding at onset, controlled with pressure.  He has some pain in the dorsum of the hand when he flexes the finger.       Home Medications Prior to Admission medications   Medication Sig Start Date End Date Taking? Authorizing Provider  acetaminophen (TYLENOL) 325 MG tablet Take 2 tablets (650 mg total) by mouth every 6 (six) hours as needed for fever or headache (pain). 04/23/19   Narda Bonds, MD  albuterol (VENTOLIN HFA) 108 (90 Base) MCG/ACT inhaler Inhale 2 puffs into the lungs every 6 (six) hours as needed for wheezing or shortness of breath. 04/23/19   Narda Bonds, MD  apixaban (ELIQUIS) 5 MG TABS tablet Take 1 tablet (5 mg total) by mouth 2 (two) times daily. 04/23/19   Narda Bonds, MD  co-enzyme Q-10 30 MG capsule Take 7 capsules (210 mg total) by mouth daily. 04/30/19   Micki Riley, MD  flecainide (TAMBOCOR) 50 MG tablet Take 50 mg by mouth 2 (two) times daily.    [provider]  Boris Lown Oil 500 MG CAPS Take 500 mg by mouth daily.    [provider]  meclizine (ANTIVERT) 25 MG tablet Take 1 tablet (25 mg total) by mouth 3 (three) times daily as needed for dizziness. 08/28/20   Horton, Mayer Masker, MD  Oxymetazoline HCl (VICKS SINEX NA) Place 1 spray into both nostrils daily as needed (congestion/shortness of breath).     [provider]  simvastatin (ZOCOR) 10 MG tablet Take 1 tablet (10 mg total) by mouth every evening. 04/30/19 04/29/20  Micki Riley, MD  vitamin C (VITAMIN C) 1000 MG tablet Take 1 tablet (1,000 mg total) by mouth daily. 04/24/19   Narda Bonds, MD  zinc sulfate 220 (50 Zn) MG capsule Take 1 capsule (220 mg total) by mouth daily. 04/24/19   Narda Bonds, MD      Allergies    Sulfa antibiotics    Review of Systems   Review of Systems  Physical Exam Updated Vital Signs BP (!) 141/83 (BP Location: Right Arm)   Pulse 99   Temp 98.2 F (36.8 C)   Resp 16   SpO2 92%   Physical Exam Vitals and nursing note reviewed.  Constitutional:      Appearance: He is well-developed.  HENT:     Head: Normocephalic and atraumatic.  Eyes:     Conjunctiva/sclera: Conjunctivae normal.  Cardiovascular:     Pulses: Normal pulses. No decreased pulses.  Musculoskeletal:        General: Tenderness present.     Right hand: No tenderness or bony tenderness. Normal range of motion.     Left hand: Tenderness present. No bony tenderness. Normal range of motion.  Cervical back: Normal range of motion and neck supple.     Right lower leg: No edema.     Left lower leg: No edema.     Comments: There is an approximately dime size skin avulsion overlying the MCP on the dorsum of the left hand.  Minimal bleeding.  Patient has very thin skin.  I do not visualize any deep laceration or extensor tendon injury.   Skin:    General: Skin is warm and dry.  Neurological:     Mental Status: He is alert.     Sensory: No sensory deficit.     Comments: Motor, sensation, and vascular distal to the injury is fully intact.   Psychiatric:        Mood and Affect: Mood normal.     ED Results / Procedures / Treatments   Labs (all labs ordered are listed, but only abnormal results are displayed) Labs Reviewed - No data to display  EKG None  Radiology DG Hand Complete Left  Result Date:  12/23/2022 CLINICAL DATA:  Hit hand on a piece of metal. Injury to dorsal side of 3/4 metacarpal area. EXAM: LEFT HAND - COMPLETE 3+ VIEW COMPARISON:  None Available. FINDINGS: There is no acute fracture or dislocation. Bony alignment is normal. The joint spaces are overall preserved, without significant degenerative change. There is no erosive change. There is a 2 mm metallic density foreign body in the soft tissues adjacent to the little finger distal interphalangeal joint. There is no other radiopaque foreign body. There is no soft tissue gas. IMPRESSION: 1. 2 mm metallic density foreign body in the soft tissues adjacent to the little finger distal interphalangeal joint. Correlate with history and physical exam. 2. No other radiopaque foreign body. 3. No acute osseous abnormality. Electronically Signed   By: Lesia Hausen M.D.   On: 12/23/2022 12:28    Procedures Procedures    Medications Ordered in ED Medications  bacitracin ointment (has no administration in time range)    ED Course/ Medical Decision Making/ A&P    Patient seen and examined. History obtained directly from patient.   Labs/EKG: None ordered  Imaging: Ordered x-ray of the left hand.  Medications/Fluids: Ordered: Bacitracin  Most recent vital signs reviewed and are as follows: BP (!) 141/83 (BP Location: Right Arm)   Pulse 99   Temp 98.2 F (36.8 C)   Resp 16   SpO2 92%   Initial impression: Skin avulsion.  No laceration.  Wound will not be able to be repaired with sutures.  Will plan for x-ray, cleaning, antibiotic ointment and finger splint.  12:55 PM Reassessment performed. Patient appears stable.  Splint in place.  Imaging personally visualized and interpreted including: X-ray of the hand, no fracture noted of the MCP area.  He does have a small old retained foreign body in the small finger.  He thinks this may be a pencil point and states that has been years since it has been there.  Reviewed pertinent lab  work and imaging with patient at bedside. Questions answered.   Most current vital signs reviewed and are as follows: BP (!) 141/83 (BP Location: Right Arm)   Pulse 99   Temp 98.2 F (36.8 C)   Resp 16   SpO2 92%   Plan: Discharge to home.   Prescriptions written for: Keflex x 5 days  Other home care instructions discussed: Protect the area, splint use for 3 to 5 days, pat dry if getting wet, do not submerge.  ED return instructions discussed: Pt urged to return with worsening pain, worsening swelling, expanding area of redness or streaking up extremity, fever, or any other concerns.  Urged to take complete course of antibiotics as prescribed.   Follow-up instructions discussed: Patient encouraged to follow-up with their PCP as needed.                                   Medical Decision Making Amount and/or Complexity of Data Reviewed Radiology: ordered.  Risk OTC drugs. Prescription drug management.   Patient with a skin avulsion to the dorsum of the left hand, third MCP joint area.  X-ray negative.  Do not suspect extensor tendon injury.  Do not suspect open fracture.  Bleeding controlled.  Wound bandaged.  With good wound care, hopefully wound will heal well secondarily.  Given that he cut it on a rusty piece of metal, advised antibiotics for 5 days.  Tetanus is up-to-date.        Final Clinical Impression(s) / ED Diagnoses Final diagnoses:  Skin avulsion    Rx / DC Orders ED Discharge Orders          Ordered    cephALEXin (KEFLEX) 500 MG capsule  4 times daily        12/23/22 1252              Renne Crigler, PA-C 12/23/22 1257    Derwood Kaplan, MD 12/24/22 [phone]

## 2022-12-23 NOTE — Discharge Instructions (Signed)
Please read and follow all provided instructions.  Your diagnoses today include:  1. Skin avulsion     Tests performed today include: X-ray of the affected area that did not show any foreign bodies or broken bones at the site of today's wound.  You have a old retained foreign body in your pinky finger. Vital signs. See below for your results today.   Medications prescribed:  Keflex (cephalexin) - antibiotic  You have been prescribed an antibiotic medicine: take the entire course of medicine even if you are feeling better. Stopping early can cause the antibiotic not to work.  Take any prescribed medications only as directed.   Home care instructions:  Follow any educational materials and wound care instructions contained in this packet.   Keep affected area above the level of your heart when possible to minimize swelling. Wash area gently twice a day with warm soapy water. Do not apply alcohol or hydrogen peroxide. Cover the area if it draining or weeping.   Wear the splint for 3 of the 5 days while the skin goes to the initial phases of healing.  Please be careful with extreme flexion of your fingers after that point to ensure that you do not overstress the tissue while it is healing.  Follow-up instructions: Follow-up with your doctor as needed.  Return instructions:  Return to the Emergency Department if you have: Fever Worsening pain Worsening swelling of the wound Pus draining from the wound Redness of the skin that moves away from the wound, especially if it streaks away from the affected area  Any other emergent concerns  Your vital signs today were: BP (!) 141/83 (BP Location: Right Arm)   Pulse 99   Temp 98.2 F (36.8 C)   Resp 16   SpO2 92%  If your blood pressure (BP) was elevated above 135/85 this visit, please have this repeated by your doctor within one month. --------------

## 2022-12-23 NOTE — ED Notes (Signed)
Finger s;lint with Mepitel dressing applied. (Left hand)

## 2023-01-16 ENCOUNTER — Encounter (HOSPITAL_BASED_OUTPATIENT_CLINIC_OR_DEPARTMENT_OTHER): Payer: No Typology Code available for payment source | Attending: Physician Assistant | Admitting: Physician Assistant

## 2023-01-16 DIAGNOSIS — W450XXA Nail entering through skin, initial encounter: Secondary | ICD-10-CM | POA: Diagnosis not present

## 2023-01-16 DIAGNOSIS — S61412A Laceration without foreign body of left hand, initial encounter: Secondary | ICD-10-CM | POA: Diagnosis present

## 2023-01-16 DIAGNOSIS — I10 Essential (primary) hypertension: Secondary | ICD-10-CM | POA: Diagnosis not present

## 2023-01-16 NOTE — Progress Notes (Signed)
TEONDRE, BELLMORE ([id number]) [id number] Page 1 of 7 Visit Report for 01/16/2023 Allergy List Details Patient Name: Date of Service: Raymond Camacho RD N. 01/16/2023 9:30 A M Medical Record Number: [id number] Patient Account Number: [id number] Date of Birth/Sex: Treating RN: 1941/03/25 (82 y.o. M) Primary Care Nialah Saravia: Sondra Come Other Clinician: Referring Kooper Godshall: Treating Serenidy Waltz/Extender: Jethro Bastos in Treatment: 0 Allergies Active Allergies Sulfa (Sulfonamide Antibiotics) Reaction: vomiting Severity: Severe Allergy Notes Electronic Signature(s) Signed: 01/16/2023 10:05:19 AM By: Pearletha Alfred Entered By: Pearletha Alfred on 01/16/2023 07:05:18 -------------------------------------------------------------------------------- Arrival Information Details Patient Name: Date of Service: Raymond Camacho, Raymond Camacho RD N. 01/16/2023 9:30 A M Medical Record Number: [id number] Patient Account Number: [id number] Date of Birth/Sex: Treating RN: 1940-05-16 (81 y.o. M) Primary Care Arriah Wadle: Sondra Come Other Clinician: Referring Alezandra Egli: Treating Mackinsey Pelland/Extender: Jethro Bastos in Treatment: 0 Visit Information Patient Arrived: Ambulatory Arrival Time: 09:28 Accompanied By: self Transfer Assistance: None Patient Identification Verified: Yes Secondary Verification Process Completed: Yes Patient Requires Transmission-Based Precautions: No Patient Has Alerts: No Electronic Signature(s) Signed: 01/16/2023 10:05:08 AM By: Pearletha Alfred Entered By: Pearletha Alfred on 01/16/2023 07:05:08 Clinic Level of Care Assessment Details -------------------------------------------------------------------------------- Mardee Postin ([id number]) [id number] Page 2 of 7 Patient Name: Date of Service: Raymond Camacho RD N. 01/16/2023 9:30 A M Medical Record Number: [id number] Patient Account Number: [id number] Date of  Birth/Sex: Treating RN: 1940/12/17 (82 y.o. Raymond Camacho Primary Care Kelson Queenan: Sondra Come Other Clinician: Referring Romel Dumond: Treating Donathan Buller/Extender: Jethro Bastos in Treatment: 0 Clinic Level of Care Assessment Items TOOL 1 Quantity Score X- 1 0 Use when EandM and Procedure is performed on INITIAL visit ASSESSMENTS - Nursing Assessment / Reassessment X- 1 20 General Physical Exam (combine w/ comprehensive assessment (listed just below) when performed on new pt. evals) X- 1 25 Comprehensive Assessment (HX, ROS, Risk Assessments, Wounds Hx, etc.) ASSESSMENTS - Wound and Skin Assessment / Reassessment X- 1 10 Dermatologic / Skin Assessment (not related to wound area) ASSESSMENTS - Ostomy and/or Continence Assessment and Care []  - 0 Incontinence Assessment and Management []  - 0 Ostomy Care Assessment and Management (repouching, etc.) PROCESS - Coordination of Care X - Simple Patient / Family Education for ongoing care 1 15 []  - 0 Complex (extensive) Patient / Family Education for ongoing care X- 1 10 Staff obtains Chiropractor, Records, T Results / Process Orders est []  - 0 Staff telephones HHA, Nursing Homes / Clarify orders / etc []  - 0 Routine Transfer to another Facility (non-emergent condition) []  - 0 Routine Hospital Admission (non-emergent condition) X- 1 15 New Admissions / Manufacturing engineer / Ordering NPWT Apligraf, etc. , []  - 0 Emergency Hospital Admission (emergent condition) PROCESS - Special Needs []  - 0 Pediatric / Minor Patient Management []  - 0 Isolation Patient Management []  - 0 Hearing / Language / Visual special needs []  - 0 Assessment of Community assistance (transportation, D/C planning, etc.) []  - 0 Additional assistance / Altered mentation []  - 0 Support Surface(s) Assessment (bed, cushion, seat, etc.) INTERVENTIONS - Miscellaneous []  - 0 External ear exam []  - 0 Patient Transfer (multiple staff /  Nurse, adult / Similar devices) []  - 0 Simple Staple / Suture removal (25 or less) []  - 0 Complex Staple / Suture removal (26 or more) []  - 0 Hypo/Hyperglycemic Management (do not check if billed separately) []  - 0 Ankle / Brachial Index (ABI) - do not check if billed separately Has the patient been  seen at the hospital within the last three years: Yes Total Score: 95 Level Of Care: New/Established - Level 3 Electronic Signature(s) Signed: 01/16/2023 6:09:26 PM By: Shawn Stall RN, BSN Entered By: Shawn Stall on 01/16/2023 07:54:15 Mardee Postin ([id number]) [id number] Page 3 of 7 -------------------------------------------------------------------------------- Encounter Discharge Information Details Patient Name: Date of Service: Raymond Camacho RD N. 01/16/2023 9:30 A M Medical Record Number: [id number] Patient Account Number: [id number] Date of Birth/Sex: Treating RN: 1940-08-14 (82 y.o. Raymond Camacho Primary Care Sola Margolis: Sondra Come Other Clinician: Referring Laporscha Linehan: Treating Akire Rennert/Extender: Jethro Bastos in Treatment: 0 Encounter Discharge Information Items Post Procedure Vitals Discharge Condition: Stable Temperature (F): 97.6 Ambulatory Status: Ambulatory Pulse (bpm): 81 Discharge Destination: Home Respiratory Rate (breaths/min): 18 Transportation: Private Auto Blood Pressure (mmHg): 142/86 Accompanied By: self Schedule Follow-up Appointment: Yes Clinical Summary of Care: Electronic Signature(s) Signed: 01/16/2023 6:09:26 PM By: Shawn Stall RN, BSN Entered By: Shawn Stall on 01/16/2023 07:55:06 -------------------------------------------------------------------------------- Lower Extremity Assessment Details Patient Name: Date of Service: Raymond Camacho RD N. 01/16/2023 9:30 A M Medical Record Number: [id number] Patient Account Number: [id number] Date of Birth/Sex: Treating RN: 12/11/1940 (82 y.o.  M) Primary Care Davell Beckstead: Sondra Come Other Clinician: Referring Cobie Leidner: Treating Levetta Bognar/Extender: Jethro Bastos in Treatment: 0 Notes no leg wound, wound on hand Electronic Signature(s) Signed: 01/16/2023 10:06:08 AM By: Pearletha Alfred Entered By: Pearletha Alfred on 01/16/2023 07:06:08 -------------------------------------------------------------------------------- Multi-Disciplinary Care Plan Details Patient Name: Date of Service: Raymond Camacho, Raymond Camacho RD N. 01/16/2023 9:30 A M Medical Record Number: [id number] Patient Account Number: [id number] Date of Birth/Sex: Treating RN: 12-Jan-1941 (82 y.o. Raymond Camacho Primary Care Zayley Arras: Sondra Come Other Clinician: Referring Sariah Henkin: Treating Suhayb Anzalone/Extender: Jethro Bastos in Treatment: [address] KEIGAN, SALDARRIAGA ([id number]) [id number] Page 4 of 7 Orientation to the Wound Care Program Nursing Diagnoses: Knowledge deficit related to the wound healing center program Goals: Patient/caregiver will verbalize understanding of the Wound Healing Center Program Date Initiated: 01/16/2023 Target Resolution Date: 02/15/2023 Goal Status: Active Interventions: Provide education on orientation to the wound center Notes: Pain, Acute or Chronic Nursing Diagnoses: Pain, acute or chronic: actual or potential Potential alteration in comfort, pain Goals: Patient will verbalize adequate pain control and receive pain control interventions during procedures as needed Date Initiated: 01/16/2023 Target Resolution Date: 02/15/2023 Goal Status: Active Patient/caregiver will verbalize comfort level met Date Initiated: 01/16/2023 Target Resolution Date: 02/15/2023 Goal Status: Active Interventions: Encourage patient to take pain medications as prescribed Provide education on pain management Reposition patient for comfort Treatment Activities: Administer pain control measures as  ordered : 01/16/2023 Notes: Wound/Skin Impairment Nursing Diagnoses: Knowledge deficit related to ulceration/compromised skin integrity Goals: Patient/caregiver will verbalize understanding of skin care regimen Date Initiated: 01/16/2023 Target Resolution Date: 02/15/2023 Goal Status: Active Interventions: Assess patient/caregiver ability to perform ulcer/skin care regimen upon admission and as needed Assess ulceration(s) every visit Provide education on ulcer and skin care Treatment Activities: Skin care regimen initiated : 01/16/2023 Topical wound management initiated : 01/16/2023 Notes: Electronic Signature(s) Signed: 01/16/2023 6:09:26 PM By: Shawn Stall RN, BSN Entered By: Shawn Stall on 01/16/2023 07:27:26 -------------------------------------------------------------------------------- Pain Assessment Details Patient Name: Date of Service: Raymond Camacho RD N. 01/16/2023 9:30 A M Medical Record Number: [id number] Patient Account Number: [id number] Date of Birth/Sex: Treating RN: Mar 17, 1941 (82 y.o. Raymond Camacho [place], Ewing Schlein ([id number]) [id number] Page 5 of 7 Primary Care Jacquel Mccamish: Sondra Come Other Clinician: Referring Nikkia Devoss: Treating Emmerich Cryer/Extender: Larina Bras  III, Fuller Song, Remi Deter Weeks in Treatment: 0 Active Problems Location of Pain Severity and Description of Pain Patient Has Paino No Site Locations Rate the pain. Current Pain Level: 0 Pain Management and Medication Current Pain Management: Medication: No Cold Application: No Rest: No Massage: No Activity: No T.E.N.S.: No Heat Application: No Leg drop or elevation: No Is the Current Pain Management Adequate: Adequate How does your wound impact your activities of daily livingo Sleep: No Bathing: No Appetite: No Relationship With Others: No Bladder Continence: No Emotions: No Bowel Continence: No Work: No Toileting: No Drive: No Dressing: No Hobbies: No Investment banker, corporate) Signed: 01/16/2023 6:09:26 PM By: Shawn Stall RN, BSN Entered By: Shawn Stall on 01/16/2023 07:49:18 -------------------------------------------------------------------------------- Patient/Caregiver Education Details Patient Name: Date of Service: Raymond Camacho RD Raymond Camacho 9/4/2024andnbsp9:30 A M Medical Record Number: [id number] Patient Account Number: [id number] Date of Birth/Gender: Treating RN: [birth year] (82 y.o. Raymond Camacho Primary Care Physician: Sondra Come Other Clinician: Referring Physician: Treating Physician/Extender: Jethro Bastos in Treatment: 0 Education Assessment Education Provided To: Patient Education Topics Provided Welcome T [facility]-New Patient Packet: o Handouts: The Wound Healing Pledge form Methods: Explain/Verbal, Printed WESLIE, HENAULT ([id number]) [id number] Page 6 of 7 Responses: Reinforcements needed Wound/Skin Impairment: Electronic Signature(s) Signed: 01/16/2023 6:09:26 PM By: Shawn Stall RN, BSN Entered By: Shawn Stall on 01/16/2023 07:27:45 -------------------------------------------------------------------------------- Wound Assessment Details Patient Name: Date of Service: Raymond Camacho, Raymond Camacho RD N. 01/16/2023 9:30 A M Medical Record Number: [id number] Patient Account Number: [id number] Date of Birth/Sex: Treating RN: [birth year] (82 y.o. M) Primary Care Lilyrose Tanney: Sondra Come Other Clinician: Referring Shevawn Langenberg: Treating Pal Shell/Extender: Jethro Bastos in Treatment: 0 Wound Status Wound Number: 1 Primary Skin Tear Etiology: Wound Location: Left, Anterior Hand - Web Between 2nd and 3rd Digit Wound Status: Open Wounding Event: Laceration Comorbid Cataracts, Chronic sinus problems/congestion, Osteoarthritis, History: Neuropathy Date Acquired: 12/23/2022 Weeks Of Treatment: 0 Clustered Wound: No Photos Wound Measurements Length: (cm)  1 Width: (cm) 1.6 Depth: (cm) 0.1 Area: (cm) 1.257 Volume: (cm) 0.126 % Reduction in Area: % Reduction in Volume: Epithelialization: Small (1-33%) Tunneling: No Undermining: No Wound Description Classification: Full Thickness Without Exposed Suppor Wound Margin: Distinct, outline attached Exudate Amount: Medium Exudate Type: Serosanguineous Exudate Color: red, brown t Structures Foul Odor After Cleansing: No Slough/Fibrino No Wound Bed Granulation Amount: Small (1-33%) Exposed Structure Granulation Quality: Red, Pink, Hyper-granulation Fascia Exposed: No Necrotic Amount: Large (67-100%) Fat Layer (Subcutaneous Tissue) Exposed: Yes Necrotic Quality: Adherent Slough Tendon Exposed: No Muscle Exposed: No Joint Exposed: No Bone Exposed: No [address] HARLESS, LUMBARD ([id number]) [id number] Page 7 of 7 No Abnormalities Noted: No No Abnormalities Noted: No Callus: No Atrophie Blanche: No Crepitus: No Cyanosis: No Excoriation: No Ecchymosis: No Induration: No Erythema: No Rash: No Hemosiderin Staining: No Scarring: No Mottled: No Pallor: No Moisture Rubor: No No Abnormalities Noted: No Dry / Scaly: No Temperature / Pain Maceration: No Temperature: No Abnormality Treatment Notes Wound #1 (Hand - Web Between 2nd and 3rd Digit) Wound Laterality: Left, Anterior Cleanser Soap and Water Discharge Instruction: May shower and wash wound with dial antibacterial soap and water prior to dressing change. Peri-Wound Care Topical Primary Dressing Xeroform Occlusive Gauze Dressing, 4x4 in Discharge Instruction: Apply to wound bed as instructed Secondary Dressing bandaid Discharge Instruction: apply over the primary dressing. Secured With Compression Wrap Compression Stockings Facilities manager) Signed: 01/16/2023 6:09:26 PM By: Shawn Stall RN, BSN Entered  By: Shawn Stall on 01/16/2023  07:49:05 -------------------------------------------------------------------------------- Vitals Details Patient Name: Date of Service: Raymond Camacho RD N. 01/16/2023 9:30 A M Medical Record Number: [id number] Patient Account Number: [id number] Date of Birth/Sex: Treating RN: 1941/03/18 (82 y.o. M) Primary Care Alicia Seib: Sondra Come Other Clinician: Referring Derik Fults: Treating Dahlia Nifong/Extender: Jethro Bastos in Treatment: 0 Vital Signs Time Taken: 09:29 Temperature (F): 97.6 Height (in): 71 Pulse (bpm): 81 Source: Stated Respiratory Rate (breaths/min): 18 Weight (lbs): 212 Blood Pressure (mmHg): 142/86 Source: Stated Reference Range: 80 - 120 mg / dl Body Mass Index (BMI): 29.6 Electronic Signature(s) Signed: 01/16/2023 10:05:13 AM By: Pearletha Alfred Entered ByPearletha Alfred on 01/16/2023 07:05:13

## 2023-01-16 NOTE — Progress Notes (Signed)
Raymond Camacho ([id number]) [id number] Page 1 of 4 Visit Report for 01/16/2023 Abuse Risk Screen Details Patient Name: Date of Service: Raymond Camacho RD N. 01/16/2023 9:30 A M Medical Record Number: [id number] Patient Account Number: [id number] Date of Birth/Sex: Treating RN: [birth year] (82 y.o. Male) Primary Care Breken Nazari: Sondra Come Other Clinician: Referring Lenni Reckner: Treating Chrissie Dacquisto/Extender: Jethro Bastos in Treatment: 0 Abuse Risk Screen Items Answer ABUSE RISK SCREEN: Has anyone close to you tried to hurt or harm you recentlyo No Do you feel uncomfortable with anyone in your familyo No Has anyone forced you do things that you didnt want to doo No Electronic Signature(s) Signed: 01/16/2023 10:05:32 AM By: Pearletha Alfred Entered By: Pearletha Alfred on 01/16/2023 07:05:32 -------------------------------------------------------------------------------- Activities of Daily Living Details Patient Name: Date of Service: Raymond Camacho RD N. 01/16/2023 9:30 A M Medical Record Number: [id number] Patient Account Number: [id number] Date of Birth/Sex: Treating RN: September 11, 1940 82 y.o. Male) Primary Care Raymond Camacho: Sondra Come Other Clinician: Referring Shani Fitch: Treating Amerigo Mcglory/Extender: Jethro Bastos in Treatment: 0 Activities of Daily Living Items Answer Activities of Daily Living (Please select one for each item) Drive Automobile Completely Able T Medications ake Completely Able Use T elephone Completely Able Care for Appearance Completely Able Use T oilet Completely Able Bath / Shower Completely Able Dress Self Completely Able Feed Self Completely Able Walk Completely Able Get In / Out Bed Completely Able Housework Completely Able Prepare Meals Completely Able Handle Money Completely Able Shop for Self Completely Able Electronic Signature(s) Signed: 01/16/2023 10:05:38 AM By: Pearletha Alfred Entered By: Pearletha Alfred on 01/16/2023 07:05:38 Mardee Postin ([id number]) [id number] Page 2 of 4 -------------------------------------------------------------------------------- Education Screening Details Patient Name: Date of Service: Raymond Camacho RD N. 01/16/2023 9:30 A M Medical Record Number: [id number] Patient Account Number: [id number] Date of Birth/Sex: Treating RN: December 23, 1940 (82 y.o. Male) Primary Care [place] Doig: Sondra Come Other Clinician: Referring Paulett Kaufhold: Treating Holbert Caples/Extender: Jethro Bastos in Treatment: 0 Primary Learner Assessed: Patient Learning Preferences/Education Level/Primary Language Learning Preference: Explanation, Demonstration, Printed Material Highest Education Level: College or Above Preferred Language: English Cognitive Barrier Language Barrier: No Translator Needed: No Memory Deficit: No Emotional Barrier: No Cultural/Religious Beliefs Affecting Medical Care: No Physical Barrier Impaired Vision: Yes Glasses Impaired Hearing: No Decreased Hand dexterity: No Knowledge/Comprehension Knowledge Level: High Comprehension Level: High Ability to understand written instructions: High Ability to understand verbal instructions: High Motivation Anxiety Level: Calm Cooperation: Cooperative Education Importance: Acknowledges Need Interest in Health Problems: Asks Questions Perception: Coherent Willingness to Engage in Self-Management High Activities: Readiness to Engage in Self-Management High Activities: Electronic Signature(s) Signed: 01/16/2023 10:05:43 AM By: Pearletha Alfred Entered By: Pearletha Alfred on 01/16/2023 07:05:43 -------------------------------------------------------------------------------- Fall Risk Assessment Details Patient Name: Date of Service: Raymond Camacho, Raymond Camacho RD N. 01/16/2023 9:30 A M Medical Record Number: [id number] Patient Account Number: [id number] Date of  Birth/Sex: Treating RN: 1941-01-16 (82 y.o. Male) Primary Care Raymond Camacho: Sondra Come Other Clinician: Referring Raymond Camacho: Treating Morell Mears/Extender: Jethro Bastos in Treatment: 0 Fall Risk Assessment Items Have you had 2 or more falls in the last 12 monthso 0 No KRISHAUN, NOOR ([id number]) [id number] Page 3 of 4 Have you had any fall that resulted in injury in the last 12 monthso 0 No FALLS RISK SCREEN History of falling - immediate or within 3 months 0 No Secondary diagnosis (Do you have 2 or more medical diagnoseso) 0 No Ambulatory aid None/bed  rest/wheelchair/nurse 0 Yes Crutches/cane/walker 0 No Furniture 0 No Intravenous therapy Access/Saline/Heparin Lock 0 No Gait/Transferring Normal/ bed rest/ wheelchair 0 Yes Weak (short steps with or without shuffle, stooped but able to lift head while walking, may seek 0 No support from furniture) Impaired (short steps with shuffle, may have difficulty arising from chair, head down, impaired 0 No balance) Mental Status Oriented to own ability 0 Yes Electronic Signature(s) Signed: 01/16/2023 10:05:49 AM By: Pearletha Alfred Entered By: Pearletha Alfred on 01/16/2023 07:05:49 -------------------------------------------------------------------------------- Foot Assessment Details Patient Name: Date of Service: Raymond Camacho, Raymond Camacho RD N. 01/16/2023 9:30 A M Medical Record Number: [id number] Patient Account Number: [id number] Date of Birth/Sex: Treating RN: [birth year] (82 y.o. Male) Primary Care Raymond Camacho: Sondra Come Other Clinician: Referring Raymond Camacho: Treating Raymond Camacho/Extender: Jethro Bastos in Treatment: 0 Foot Assessment Items Site Locations + = Sensation present, - = Sensation absent, C = Callus, U = Ulcer R = Redness, W = Warmth, M = Maceration, PU = Pre-ulcerative lesion F = Fissure, S = Swelling, D = Dryness Assessment Right: Left: Other Deformity: No  No Prior Foot Ulcer: No No Prior Amputation: No No Charcot Joint: No No Ambulatory Status: Ambulatory Without Help GaitYOON, Camacho ([id number]) [phone] [id number] Page 4 of 4 Notes no foot wounds Electronic Signature(s) Signed: 01/16/2023 10:06:00 AM By: Pearletha Alfred Entered By: Pearletha Alfred on 01/16/2023 07:06:00 -------------------------------------------------------------------------------- Nutrition Risk Screening Details Patient Name: Date of Service: Raymond Camacho RD N. 01/16/2023 9:30 A M Medical Record Number: [id number] Patient Account Number: [id number] Date of Birth/Sex: Treating RN: 1940-07-04 (82 y.o. Male) Primary Care Nico Rogness: Sondra Come Other Clinician: Referring Fin Hupp: Treating Trisha Ken/Extender: Jethro Bastos in Treatment: 0 Height (in): 71 Weight (lbs): 212 Body Mass Index (BMI): 29.6 Nutrition Risk Screening Items Score Screening NUTRITION RISK SCREEN: I have an illness or condition that made me change the kind and/or amount of food I eat 0 No I eat fewer than two meals per day 0 No I eat few fruits and vegetables, or milk products 0 No I have three or more drinks of beer, liquor or wine almost every day 0 No I have tooth or mouth problems that make it hard for me to eat 0 No I don't always have enough money to buy the food I need 0 No I eat alone most of the time 0 No I take three or more different prescribed or over-the-counter drugs a day 1 Yes Without wanting to, I have lost or gained 10 pounds in the last six months 0 No I am not always physically able to shop, cook and/or feed myself 0 No Nutrition Protocols Good Risk Protocol 0 No interventions needed Moderate Risk Protocol High Risk Proctocol Risk Level: Good Risk Score: 1 Electronic Signature(s) Signed: 01/16/2023 10:05:54 AM By: Pearletha Alfred Entered ByPearletha Alfred on 01/16/2023 07:05:54

## 2023-01-23 ENCOUNTER — Encounter (HOSPITAL_BASED_OUTPATIENT_CLINIC_OR_DEPARTMENT_OTHER): Payer: No Typology Code available for payment source | Admitting: Physician Assistant

## 2023-01-23 DIAGNOSIS — S61412A Laceration without foreign body of left hand, initial encounter: Secondary | ICD-10-CM | POA: Diagnosis not present

## 2023-01-23 NOTE — Progress Notes (Signed)
COLESTON, SARFF ([id number]) [id number] Page 1 of 6 Visit Report for 01/23/2023 Arrival Information Details Patient Name: Date of Service: Raymond Camacho RD N. 01/23/2023 12:30 PM Medical Record Number: [id number] Patient Account Number: [id number] Date of Birth/Sex: Treating RN: [birth year] (82 y.o. Raymond Camacho Primary Care Raymond Camacho: Raymond Camacho Other Clinician: Referring Raymond Camacho: Treating Raymond Camacho/Extender: Raymond Camacho in Treatment: 1 Visit Information History Since Last Visit Added or deleted any medications: No Patient Arrived: Ambulatory Any new allergies or adverse reactions: No Arrival Time: 12:45 Had a fall or experienced change in No Accompanied By: self activities of daily living that may affect Transfer Assistance: None risk of falls: Patient Identification Verified: Yes Signs or symptoms of abuse/neglect since last visito No Secondary Verification Process Completed: Yes Hospitalized since last visit: No Patient Requires Transmission-Based Precautions: No Implantable device outside of the clinic excluding No Patient Has Alerts: No cellular tissue based products placed in the center since last visit: Has Dressing in Place as Prescribed: Yes Pain Present Now: Yes Electronic Signature(s) Signed: 01/23/2023 2:26:34 PM By: Redmond Pulling RN, BSN Entered By: Redmond Pulling on 01/23/2023 12:46:16 -------------------------------------------------------------------------------- Encounter Discharge Information Details Patient Name: Date of Service: Raymond Camacho, Raymond Camacho RD N. 01/23/2023 12:30 PM Medical Record Number: [id number] Patient Account Number: [id number] Date of Birth/Sex: Treating RN: 11/17/1940 (82 y.o. Raymond Camacho Primary Care Raymond Camacho: Raymond Camacho Other Clinician: Referring Raymond Camacho: Treating Raymond Camacho/Extender: Raymond Camacho in Treatment: 1 Encounter Discharge Information Items  Post Procedure Vitals Discharge Condition: Stable Temperature (F): 98.3 Ambulatory Status: Ambulatory Pulse (bpm): 84 Discharge Destination: Home Respiratory Rate (breaths/min): 18 Transportation: Private Auto Blood Pressure (mmHg): 151/80 Accompanied By: self Schedule Follow-up Appointment: Yes Clinical Summary of Care: Patient Declined Electronic Signature(s) Signed: 01/23/2023 2:26:34 PM By: Redmond Pulling RN, BSN Entered By: Redmond Pulling on 01/23/2023 13:25:58 Raymond Camacho ([id number]) [id number] Page 2 of 6 -------------------------------------------------------------------------------- Lower Extremity Assessment Details Patient Name: Date of Service: Raymond Camacho RD N. 01/23/2023 12:30 PM Medical Record Number: [id number] Patient Account Number: [id number] Date of Birth/Sex: Treating RN: 09-16-1940 (82 y.o. Raymond Camacho Primary Care Raymond Camacho: Raymond Camacho Other Clinician: Referring Raymond Camacho: Treating Raymond Camacho/Extender: Raymond Camacho in Treatment: 1 Electronic Signature(s) Signed: 01/23/2023 2:26:34 PM By: Redmond Pulling RN, BSN Entered By: Redmond Pulling on 01/23/2023 12:46:56 -------------------------------------------------------------------------------- Multi-Disciplinary Care Plan Details Patient Name: Date of Service: Raymond Camacho, Raymond Camacho RD N. 01/23/2023 12:30 PM Medical Record Number: [id number] Patient Account Number: [id number] Date of Birth/Sex: Treating RN: [birth year] (81 y.o. Raymond Camacho Primary Care Stephaine Breshears: Raymond Camacho Other Clinician: Referring Raymond Camacho: Treating Raymond Camacho/Extender: Raymond Camacho in Treatment: 1 Active Inactive Orientation to the Wound Care Program Nursing Diagnoses: Knowledge deficit related to the wound healing center program Goals: Patient/caregiver will verbalize understanding of the Wound Healing Center Program Date Initiated: 01/16/2023 Target  Resolution Date: 02/15/2023 Goal Status: Active Interventions: Provide education on orientation to the wound center Notes: Pain, Acute or Chronic Nursing Diagnoses: Pain, acute or chronic: actual or potential Potential alteration in comfort, pain Goals: Patient will verbalize adequate pain control and receive pain control interventions during procedures as needed Date Initiated: 01/16/2023 Target Resolution Date: 02/15/2023 Goal Status: Active Patient/caregiver will verbalize comfort level met Date Initiated: 01/16/2023 Target Resolution Date: 02/15/2023 Goal Status: Active Interventions: Encourage patient to take pain medications as prescribed Provide education on pain management Reposition patient for comfort Treatment Activities: Administer pain control measures as ordered : 01/16/2023 Geraghty,  Raymond Camacho ([id number]) [id number] Page 3 of 6 Notes: Wound/Skin Impairment Nursing Diagnoses: Knowledge deficit related to ulceration/compromised skin integrity Goals: Patient/caregiver will verbalize understanding of skin care regimen Date Initiated: 01/16/2023 Target Resolution Date: 02/15/2023 Goal Status: Active Interventions: Assess patient/caregiver ability to perform ulcer/skin care regimen upon admission and as needed Assess ulceration(s) every visit Provide education on ulcer and skin care Treatment Activities: Skin care regimen initiated : 01/16/2023 Topical wound management initiated : 01/16/2023 Notes: Electronic Signature(s) Signed: 01/23/2023 2:26:34 PM By: Redmond Pulling RN, BSN Entered By: Redmond Pulling on 01/23/2023 12:54:40 -------------------------------------------------------------------------------- Pain Assessment Details Patient Name: Date of Service: Raymond Camacho RD N. 01/23/2023 12:30 PM Medical Record Number: [id number] Patient Account Number: [id number] Date of Birth/Sex: Treating RN: 1941/04/06 (82 y.o. Raymond Camacho Primary Care  Raymond Camacho: Raymond Camacho Other Clinician: Referring Raymond Camacho: Treating Raymond Camacho/Extender: Raymond Camacho in Treatment: 1 Active Problems Location of Pain Severity and Description of Pain Patient Has Paino Yes Site Locations Rate the pain. Current Pain Level: 0 Worst Pain Level: 5 Pain Management and Medication Current Pain Management: Electronic Signature(s) Signed: 01/23/2023 2:26:34 PM By: Redmond Pulling RN, BSN [place], [place] N ([id number]) [id number] Page 4 of 6 Entered By: Redmond Pulling on 01/23/2023 12:46:44 -------------------------------------------------------------------------------- Patient/Caregiver Education Details Patient Name: Date of Service: Raymond Camacho RD N. 9/11/2024andnbsp12:30 PM Medical Record Number: [id number] Patient Account Number: [id number] Date of Birth/Gender: Treating RN: 04-18-1941 (82 y.o. Raymond Camacho Primary Care Physician: Raymond Camacho Other Clinician: Referring Physician: Treating Physician/Extender: Raymond Camacho in Treatment: 1 Education Assessment Education Provided To: Patient Education Topics Provided Wound/Skin Impairment: Methods: Explain/Verbal Responses: State content correctly Electronic Signature(s) Signed: 01/23/2023 2:26:34 PM By: Redmond Pulling RN, BSN Entered By: Redmond Pulling on 01/23/2023 12:54:55 -------------------------------------------------------------------------------- Wound Assessment Details Patient Name: Date of Service: Raymond Camacho RD N. 01/23/2023 12:30 PM Medical Record Number: [id number] Patient Account Number: [id number] Date of Birth/Sex: Treating RN: [birth year] (82 y.o. Raymond Camacho Primary Care Chaquetta Schlottman: Raymond Camacho Other Clinician: Referring Berta Denson: Treating Shron Ozer/Extender: Raymond Camacho in Treatment: 1 Wound Status Wound Number: 1 Primary Skin Tear Etiology: Wound Location: Left,  Anterior Hand - Web Between 2nd and 3rd Digit Wound Status: Open Wounding Event: Laceration Comorbid Cataracts, Chronic sinus problems/congestion, Osteoarthritis, History: Neuropathy Date Acquired: 12/23/2022 Weeks Of Treatment: 1 Clustered Wound: No Photos BLY, BAE ([id number]) [id number] Page 5 of 6 Wound Measurements Length: (cm) 1 Width: (cm) 1.3 Depth: (cm) 0.1 Area: (cm) 1.021 Volume: (cm) 0.102 % Reduction in Area: 18.8% % Reduction in Volume: 19% Epithelialization: Small (1-33%) Tunneling: No Undermining: No Wound Description Classification: Full Thickness Without Exposed Suppor Wound Margin: Distinct, outline attached Exudate Amount: Medium Exudate Type: Serosanguineous Exudate Color: red, brown t Structures Foul Odor After Cleansing: No Slough/Fibrino No Wound Bed Granulation Amount: Small (1-33%) Exposed Structure Granulation Quality: Red, Pink, Hyper-granulation Fascia Exposed: No Necrotic Amount: Large (67-100%) Fat Layer (Subcutaneous Tissue) Exposed: Yes Necrotic Quality: Adherent Slough Tendon Exposed: No Muscle Exposed: No Joint Exposed: No Bone Exposed: No Periwound Skin Texture Texture Color No Abnormalities Noted: No No Abnormalities Noted: No Callus: No Atrophie Blanche: No Crepitus: No Cyanosis: No Excoriation: No Ecchymosis: No Induration: No Erythema: No Rash: No Hemosiderin Staining: No Scarring: No Mottled: No Pallor: No Moisture Rubor: No No Abnormalities Noted: No Dry / Scaly: No Temperature / Pain Maceration: No Temperature: No Abnormality Treatment Notes Wound #1 (Hand - Web Between 2nd and 3rd Digit) Wound Laterality: Left,  Anterior Cleanser Soap and Water Discharge Instruction: May shower and wash wound with dial antibacterial soap and water prior to dressing change. Peri-Wound Care Gentell AandD+E Ointment, Foil Pouch, 5 (g) Topical Primary Dressing Hydrofera Blue Ready Transfer  Foam, 2.5x2.5 (in/in) Discharge Instruction: Apply directly to wound bed as directed Secondary Dressing bandaid Discharge Instruction: apply over the primary dressing. Secured With Compression Wrap Compression Stockings Facilities manager) Signed: 01/23/2023 2:26:34 PM By: Redmond Pulling RN, BSN Entered By: Redmond Pulling on 01/23/2023 12:56:48 Raymond Camacho ([id number]) [id number] Page 6 of 6 -------------------------------------------------------------------------------- Vitals Details Patient Name: Date of Service: Raymond Camacho RD N. 01/23/2023 12:30 PM Medical Record Number: [id number] Patient Account Number: [id number] Date of Birth/Sex: Treating RN: 10-Jul-1940 (82 y.o. Raymond Camacho Primary Care Ivyanna Sibert: Raymond Camacho Other Clinician: Referring Tynlee Bayle: Treating Anel Creighton/Extender: Raymond Camacho in Treatment: 1 Vital Signs Time Taken: 12:46 Temperature (F): 98.3 Height (in): 71 Pulse (bpm): 84 Weight (lbs): 212 Respiratory Rate (breaths/min): 18 Body Mass Index (BMI): 29.6 Blood Pressure (mmHg): 151/80 Reference Range: 80 - 120 mg / dl Electronic Signature(s) Signed: 01/23/2023 2:26:34 PM By: Redmond Pulling RN, BSN Entered By: Redmond Pulling on 01/23/2023 12:59:41

## 2023-01-24 NOTE — Progress Notes (Signed)
JIBRI, FOURAKER ([id number]) [id number] Page 1 of 6 Visit Report for 01/23/2023 Chief Complaint Document Details Patient Name: Date of Service: Raymond Camacho RD N. 01/23/2023 12:30 PM Medical Record Number: [id number] Patient Account Number: [id number] Date of Birth/Sex: Treating RN: 1940/07/10 (82 y.o. M) Primary Care Provider: Sondra Come Other Clinician: Referring Provider: Treating Provider/Extender: Jethro Bastos in Treatment: 1 Information Obtained from: Patient Chief Complaint Left hand laceration Electronic Signature(s) Signed: 01/23/2023 1:05:47 PM By: Allen Derry PA-C Entered By: Allen Derry on 01/23/2023 10:05:47 -------------------------------------------------------------------------------- Debridement Details Patient Name: Date of Service: [name], [name] 01/23/2023 12:30 PM Medical Record Number: [id number] Patient Account Number: [id number] Date of Birth/Sex: Treating RN: 1940-07-27 (82 y.o. [name] Primary Care Provider: Sondra Come Other Clinician: Referring Provider: Treating Provider/Extender: Jethro Bastos in Treatment: 1 Debridement Performed for Assessment: Wound #1 Left,Anterior Hand - Web Between 2nd and 3rd Digit Performed By: Physician Lenda Kelp, PA The following information was scribed by: Redmond Pulling The information was scribed for: Lenda Kelp Debridement Type: Debridement Level of Consciousness (Pre-procedure): Awake and Alert Pre-procedure Verification/Time Out Yes - 15:10 Taken: Start Time: 15:13 Pain Control: Lidocaine 5% topical ointment Percent of Wound Bed Debrided: 100% T Area Debrided (cm): otal 1.02 Tissue and other material debrided: Non-Viable, Slough, Skin: Dermis , Skin: Epidermis, Slough Level: Skin/Epidermis Debridement Description: Selective/Open Wound Instrument: Curette Bleeding: Minimum Hemostasis Achieved:  Pressure Response to Treatment: Procedure was tolerated well Level of Consciousness (Post- Awake and Alert procedure): Post Debridement Measurements of Total Wound Length: (cm) 1 Width: (cm) 1.3 Depth: (cm) 0.1 Volume: (cm) 0.102 Character of Wound/Ulcer Post Debridement: Improved Post Procedure Diagnosis DONOVON, MIDDLEMISS ([id number]) [id number] Page 2 of 6 Same as Pre-procedure Electronic Signature(s) Signed: 01/23/2023 2:26:34 PM By: Redmond Pulling RN, BSN Signed: 01/24/2023 5:07:09 PM By: Allen Derry PA-C Entered By: Redmond Pulling on 01/23/2023 10:16:03 -------------------------------------------------------------------------------- HPI Details Patient Name: Date of Service: [name], [name] 01/23/2023 12:30 PM Medical Record Number: [id number] Patient Account Number: [id number] Date of Birth/Sex: Treating RN: [birth year] (82 y.o. M) Primary Care Provider: Sondra Come Other Clinician: Referring Provider: Treating Provider/Extender: Jethro Bastos in Treatment: 1 History of Present Illness HPI Description: 01-16-2023 upon evaluation today patient presents for initial inspection here in our clinic concerning issue that happened about 3 weeks ago. 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 Subsequently since that time he did show me the picture the tendon was not completely exposed but it was very close to you could actually see movement underneath and he is actually had good granulation tissue overgrowing this which is excellent news. Fortunately I do not see any signs of active infection locally or systemically which is great news. No fevers, chills, nausea, vomiting, or diarrhea. Patient does have a history of hypertension but no other major  medical problems. He has been using a Band-Aid with Hibiclens and was on doxycycline. He has not had any specific wound care measures performed at this point. 01-23-2023 upon evaluation today patient appears to be doing well currently in regard to his wound. He is actually showing signs of improvement though I think it may be a little bit moist with the Xeroform gauze dressing  I think we may want to make a switch here. Electronic Signature(s) Signed: 01/23/2023 1:18:23 PM By: Allen Derry PA-C Entered By: Allen Derry on 01/23/2023 10:18:23 -------------------------------------------------------------------------------- Physical Exam Details Patient Name: Date of Service: Raymond Camacho RD N. 01/23/2023 12:30 PM Medical Record Number: [id number] Patient Account Number: [id number] Date of Birth/Sex: Treating RN: Feb 01, 1941 (82 y.o. M) Primary Care Provider: Sondra Come Other Clinician: Referring Provider: Treating Provider/Extender: Jethro Bastos in Treatment: 1 Constitutional Well-nourished and well-hydrated in no acute distress. Respiratory normal breathing without difficulty. Psychiatric this patient is able to make decisions and demonstrates good insight into disease process. Alert and Oriented x 3. pleasant and cooperative. Notes Any evidence of active infection locally or systemically which is great news and in general I do believe Upon inspection patient's wound bed actually showed signs of good granulation epithelization at this point. Fortunately I do not see that we are making excellent headway towards complete closure. Electronic Signature(s) Signed: 01/23/2023 1:18:42 PM By: Horton Marshall, [place] N ([id number]) By: Wandra Feinstein.pdf Page 3 of 6 Signed: 01/23/2023 1:18:42 PM Entered By: Allen Derry on 01/23/2023 10:18:42 -------------------------------------------------------------------------------- Physician  Orders Details Patient Name: Date of Service: Raymond Camacho RD N. 01/23/2023 12:30 PM Medical Record Number: [id number] Patient Account Number: [id number] Date of Birth/Sex: Treating RN: 14-Sep-1940 (82 y.o. [name] Primary Care Provider: Sondra Come Other Clinician: Referring Provider: Treating Provider/Extender: Jethro Bastos in Treatment: 1 Verbal / Phone Orders: No Diagnosis Coding Follow-up Appointments ppointment in 1 week. Leonard Schwartz Wednesday 01/30/2023 9:45am room 7 Return A ppointment in 2 weeks. Leonard Schwartz Wednesday 02/06/2023 0945 room 9 Return A Return appointment in 3 weeks. Leonard Schwartz Wednesday room 9 Other: - Pick up over the counter waterproof Nexcare bandage knee and elbow to hold your dressing in place. Check out [employer]. Anesthetic (In clinic) Topical Lidocaine 4% applied to wound bed Bathing/ Shower/ Hygiene May shower and wash wound with soap and water. Wound Treatment Wound #1 - Hand - Web Between 2nd and 3rd Digit Wound Laterality: Left, Anterior Cleanser: Soap and Water 1 x Per Day/30 Days Discharge Instructions: May shower and wash wound with dial antibacterial soap and water prior to dressing change. Peri-Wound Care: Gentell AandD+E Ointment, Foil Pouch, 5 (g) 1 x Per Day/30 Days Prim Dressing: Hydrofera Blue Ready Transfer Foam, 2.5x2.5 (in/in) 1 x Per Day/30 Days ary Discharge Instructions: Apply directly to wound bed as directed Secondary Dressing: bandaid 1 x Per Day/30 Days Discharge Instructions: apply over the primary dressing. Patient Medications llergies: Sulfa (Sulfonamide Antibiotics) A Notifications Medication Indication Start End 01/23/2023 lidocaine DOSE topical 5 % ointment - ointment topical once daily Electronic Signature(s) Signed: 01/23/2023 2:26:34 PM By: Redmond Pulling RN, BSN Signed: 01/24/2023 5:07:09 PM By: Allen Derry PA-C Entered By: Redmond Pulling on 01/23/2023  10:17:46 -------------------------------------------------------------------------------- Problem List Details Patient Name: Date of Service: [name], [name] 01/23/2023 12:30 PM Medical Record Number: [id number] Patient Account Number: [id number] Date of Birth/Sex: Treating RN: [birth year] (82 y.o. Spence Gusmano, Ewing Schlein ([id number]) [id number] Page 4 of 6 Primary Care Provider: Sondra Come Other Clinician: Referring Provider: Treating Provider/Extender: Jethro Bastos in Treatment: 1 Active Problems ICD-10 Encounter Code Description Active Date MDM Diagnosis [phone] Laceration without foreign body of left hand, initial encounter 01/16/2023 No Yes I10 Essential (primary) hypertension 01/16/2023 No Yes Inactive Problems Resolved Problems Electronic Signature(s) Signed: 01/23/2023 1:05:39 PM By: Allen Derry PA-C Entered  By: Allen Derry on 01/23/2023 10:05:39 -------------------------------------------------------------------------------- Progress Note Details Patient Name: Date of Service: Raymond Camacho RD N. 01/23/2023 12:30 PM Medical Record Number: [id number] Patient Account Number: [id number] Date of Birth/Sex: Treating RN: 10/30/1940 (82 y.o. M) Primary Care Provider: Sondra Come Other Clinician: Referring Provider: Treating Provider/Extender: Jethro Bastos in Treatment: 1 Subjective Chief Complaint Information obtained from Patient Left hand laceration History of Present Illness (HPI) 01-16-2023 upon evaluation today patient presents for initial inspection here in our clinic concerning issue that happened about 3 weeks ago. 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 Subsequently since that time he did show me the  picture the tendon was not completely exposed but it was very close to you could actually see movement underneath and he is actually had good granulation tissue overgrowing this which is excellent news. Fortunately I do not see any signs of active infection locally or systemically which is great news. No fevers, chills, nausea, vomiting, or diarrhea. Patient does have a history of hypertension but no other major medical problems. He has been using a Band-Aid with Hibiclens and was on doxycycline. He has not had any specific wound care measures performed at this point. 01-23-2023 upon evaluation today patient appears to be doing well currently in regard to his wound. He is actually showing signs of improvement though I think it may be a little bit moist with the Xeroform gauze dressing I think we may want to make a switch here. Objective Constitutional Well-nourished and well-hydrated in no acute distress. Vitals Time Taken: 12:46 PM, Height: 71 in, Weight: 212 lbs, BMI: 29.6, Temperature: 98.3 F, Pulse: 84 bpm, Respiratory Rate: 18 breaths/min, Blood Pressure: 151/80 mmHg. JALEON, VIDRINE ([id number]) [id number] Page 5 of 6 Respiratory normal breathing without difficulty. Psychiatric this patient is able to make decisions and demonstrates good insight into disease process. Alert and Oriented x 3. pleasant and cooperative. General Notes: Any evidence of active infection locally or systemically which is great news and in general I do believe Upon inspection patient's wound bed actually showed signs of good granulation epithelization at this point. Fortunately I do not see that we are making excellent headway towards complete closure. Integumentary (Hair, Skin) Wound #1 status is Open. Original cause of wound was Laceration. The date acquired was: 12/23/2022. The wound has been in treatment 1 weeks. The wound is located on the Left,Anterior Hand - Web Between 2nd and 3rd  Digit. The wound measures 1cm length x 1.3cm width x 0.1cm depth; 1.021cm^2 area and 0.102cm^3 volume. There is Fat Layer (Subcutaneous Tissue) exposed. There is no tunneling or undermining noted. There is a medium amount of serosanguineous drainage noted. The wound margin is distinct with the outline attached to the wound base. There is small (1-33%) red, pink, hyper - granulation within the wound bed. There is a large (67-100%) amount of necrotic tissue within the wound bed including Adherent Slough. The periwound skin appearance did not exhibit: Callus, Crepitus, Excoriation, Induration, Rash, Scarring, Dry/Scaly, Maceration, Atrophie Blanche, Cyanosis, Ecchymosis, Hemosiderin Staining, Mottled, Pallor, Rubor, Erythema. Periwound temperature was noted as No Abnormality. Assessment Active Problems ICD-10 Laceration without foreign body of left hand, initial encounter Essential (primary) hypertension Procedures Wound #1 Pre-procedure diagnosis of Wound #1 is a Skin  T located on the Left,Anterior Hand - Web Between 2nd and 3rd Digit . There was a Selective/Open Wound ear Skin/Epidermis Debridement with a total area of 1.02 sq cm performed by Lenda Kelp, PA. With the following instrument(s): Curette to remove Non-Viable tissue/material. Material removed includes Slough, Skin: Dermis, and Skin: Epidermis after achieving pain control using Lidocaine 5% topical ointment. No specimens were taken. A time out was conducted at 15:10, prior to the start of the procedure. A Minimum amount of bleeding was controlled with Pressure. The procedure was tolerated well. Post Debridement Measurements: 1cm length x 1.3cm width x 0.1cm depth; 0.102cm^3 volume. Character of Wound/Ulcer Post Debridement is improved. Post procedure Diagnosis Wound #1: Same as Pre-Procedure Plan Follow-up Appointments: Return Appointment in 1 week. Leonard Schwartz Wednesday 01/30/2023 9:45am room 7 Return Appointment in 2 weeks. Leonard Schwartz  Wednesday 02/06/2023 0945 room 9 Return appointment in 3 weeks. Leonard Schwartz Wednesday room 9 Other: - Pick up over the counter waterproof Nexcare bandage knee and elbow to hold your dressing in place. Check out [employer]. Anesthetic: (In clinic) Topical Lidocaine 4% applied to wound bed Bathing/ Shower/ Hygiene: May shower and wash wound with soap and water. The following medication(s) was prescribed: lidocaine topical 5 % ointment ointment topical once daily was prescribed at facility WOUND #1: - Hand - Web Between 2nd and 3rd Digit Wound Laterality: Left, Anterior Cleanser: Soap and Water 1 x Per Day/30 Days Discharge Instructions: May shower and wash wound with dial antibacterial soap and water prior to dressing change. Peri-Wound Care: Gentell AandD+E Ointment, Foil Pouch, 5 (g) 1 x Per Day/30 Days Prim Dressing: Hydrofera Blue Ready Transfer Foam, 2.5x2.5 (in/in) 1 x Per Day/30 Days ary Discharge Instructions: Apply directly to wound bed as directed Secondary Dressing: bandaid 1 x Per Day/30 Days Discharge Instructions: apply over the primary dressing. 1. I am going to recommend that we have the patient continue to monitor for any evidence of overall worsening I do believe that we are seeing improvement I would recommend AandD ointment followed by [facility] I think this may be a good option here considering the hypergranulation which I think is slow in the healing just a bit. 2. I am good recommend as well that we have the patient use to the Band-Aid to secure in place following this and a waterproof Band-Aid is ideal. 3. And also can recommend he should continue to monitor for any signs of worsening infection if anything changes he knows contact the office and let me know. We will see patient back for reevaluation in 1 week here in the clinic. If anything worsens or changes patient will contact our office for additional recommendations. SHION, STIFEL ([id number])  [id number] Page 6 of 6 Electronic Signature(s) Signed: 01/23/2023 1:19:21 PM By: Allen Derry PA-C Entered By: Allen Derry on 01/23/2023 10:19:21 -------------------------------------------------------------------------------- SuperBill Details Patient Name: Date of Service: [name], [name] 01/23/2023 Medical Record Number: [id number] Patient Account Number: [id number] Date of Birth/Sex: Treating RN: 1940-10-30 (82 y.o. M) Primary Care Provider: Sondra Come Other Clinician: Referring Provider: Treating Provider/Extender: Jethro Bastos in Treatment: 1 Diagnosis Coding ICD-10 Codes Code Description [phone] Laceration without foreign body of left hand, initial encounter I10 Essential (primary) hypertension Facility Procedures : CPT4 Code: [id number] Description: 97597 - DEBRIDE WOUND 1ST 20 SQ CM OR < ICD-10 Diagnosis Description S61.412A Laceration without foreign body of left hand, initial encounter Modifier: Quantity: 1 Physician Procedures : CPT4 Code Description Modifier [phone] -  WC PHYS DEBR WO ANESTH 20 SQ CM ICD-10 Diagnosis Description S61.412A Laceration without foreign body of left hand, initial encounter Quantity: 1 Electronic Signature(s) Signed: 01/23/2023 1:19:28 PM By: Allen Derry PA-C Entered By: Allen Derry on 01/23/2023 10:19:28

## 2023-01-30 ENCOUNTER — Encounter (HOSPITAL_BASED_OUTPATIENT_CLINIC_OR_DEPARTMENT_OTHER): Payer: No Typology Code available for payment source | Admitting: Physician Assistant

## 2023-01-30 DIAGNOSIS — S61412A Laceration without foreign body of left hand, initial encounter: Secondary | ICD-10-CM | POA: Diagnosis not present

## 2023-01-30 NOTE — Progress Notes (Addendum)
AHREN, PETTINGER ([id number]) [id number] Page 1 of 5 Visit Report for 01/30/2023 Chief Complaint Document Details Patient Name: Date of Service: Raymond Burn RD N. 01/30/2023 9:45 A M Medical Record Number: [id number] Patient Account Number: [id number] Date of Birth/Sex: Treating RN: December 26, 1940 (82 y.o. M) Primary Care Provider: Sondra Come Other Clinician: Referring Provider: Treating Provider/Extender: Jethro Bastos in Treatment: 2 Information Obtained from: Patient Chief Complaint Left hand laceration Electronic Signature(s) Signed: 01/30/2023 10:25:54 AM By: Allen Derry PA-C Entered By: Allen Derry on 01/30/2023 10:25:54 -------------------------------------------------------------------------------- HPI Details Patient Name: Date of Service: Raymond Camacho, Raymond Bering RD N. 01/30/2023 9:45 A M Medical Record Number: [id number] Patient Account Number: [id number] Date of Birth/Sex: Treating RN: 1940/07/12 (82 y.o. M) Primary Care Provider: Sondra Come Other Clinician: Referring Provider: Treating Provider/Extender: Jethro Bastos in Treatment: 2 History of Present Illness HPI Description: 01-16-2023 upon evaluation today patient presents for initial inspection here in our clinic concerning issue that happened about 3 weeks ago. 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 Subsequently since that time he did show me the picture the tendon was not completely exposed but it was very close to you could actually see movement underneath and he is actually had good granulation tissue overgrowing this which is excellent news. Fortunately I do not see any signs of active infection locally or systemically which is great news. No fevers, chills,  nausea, vomiting, or diarrhea. Patient does have a history of hypertension but no other major medical problems. He has been using a Band-Aid with Hibiclens and was on doxycycline. He has not had any specific wound care measures performed at this point. 01-23-2023 upon evaluation today patient appears to be doing well currently in regard to his wound. He is actually showing signs of improvement though I think it may be a little bit moist with the Xeroform gauze dressing I think we may want to make a switch here. 01-30-2023 upon evaluation today patient appears to be doing excellent in regard to healing at this point. Fortunately I do not see any signs of active infection locally or systemically at this time which is great news and in general I do believe that we are making good headway towards complete closure which is great news. With that being said I do believe that he is really showing signs of healing quite nicely and I think that the [facility] with the AandD ointment has done an excellent job thus far. Electronic Signature(s) Signed: 01/30/2023 10:36:13 AM By: Allen Derry PA-C Entered By: Allen Derry on 01/30/2023 10:36:13 Raymond Camacho ([id number]) [id number] Page 2 of 5 -------------------------------------------------------------------------------- Physical Exam Details Patient Name: Date of Service: Raymond Burn RD N. 01/30/2023 9:45 A M Medical Record Number: [id number] Patient Account Number: [id number] Date of Birth/Sex: Treating RN: 22-Jan-1941 (82 y.o. M) Primary Care Provider: Sondra Come Other Clinician: Referring Provider: Treating Provider/Extender: Jethro Bastos in Treatment: 2 Constitutional Well-nourished and well-hydrated in no acute distress. Respiratory normal breathing without difficulty. Psychiatric this patient is able to make decisions and demonstrates good insight into disease process. Alert and  Oriented x 3. pleasant and cooperative. Notes Patient's wound on the knuckle at this point is showing signs of really good improvement no  sharp debridement was necessary today this is one of the first times we have not had to debride the wound and he is very pleased to hear this. Overall I think that he is making excellent progress towards complete closure. Electronic Signature(s) Signed: 01/30/2023 10:36:31 AM By: Allen Derry PA-C Entered By: Allen Derry on 01/30/2023 10:36:31 -------------------------------------------------------------------------------- Physician Orders Details Patient Name: Date of Service: Raymond Burn RD N. 01/30/2023 9:45 A M Medical Record Number: [id number] Patient Account Number: [id number] Date of Birth/Sex: Treating RN: Oct 23, 1940 (82 y.o. Dianna Limbo Primary Care Provider: Sondra Come Other Clinician: Referring Provider: Treating Provider/Extender: Jethro Bastos in Treatment: 2 Verbal / Phone Orders: No Diagnosis Coding ICD-10 Coding Code Description [phone] Laceration without foreign body of left hand, initial encounter I10 Essential (primary) hypertension Follow-up Appointments ppointment in 2 weeks. Leonard Schwartz Wednesday Room 9 02/13/23 at 9AM (please ask front desk for appointments) Return A Return appointment in 1 month. - Please ask front desk for appointment Other: - Pick up over the counter waterproof Nexcare bandage knee and elbow to hold your dressing in place. Check out [employer]. Anesthetic (In clinic) Topical Lidocaine 4% applied to wound bed Bathing/ Shower/ Hygiene May shower and wash wound with soap and water. Wound Treatment Wound #1 - Hand - Web Between 2nd and 3rd Digit Wound Laterality: Left, Anterior Cleanser: Soap and Water 1 x Per Day/30 Days Discharge Instructions: May shower and wash wound with dial antibacterial soap and water prior to dressing change. Peri-Wound Care: Gentell AandD+E Ointment, Foil  Pouch, 5 (g) 1 x Per Day/30 Days Raymond Camacho, Raymond Camacho ([id number]) [id number] Page 3 of 5 Prim Dressing: Hydrofera Blue Ready Transfer Foam, 2.5x2.5 (in/in) 1 x Per Day/30 Days ary Discharge Instructions: Apply directly to wound bed as directed Secondary Dressing: bandaid 1 x Per Day/30 Days Discharge Instructions: apply over the primary dressing. Electronic Signature(s) Signed: 01/30/2023 3:13:57 PM By: Karie Schwalbe RN Signed: 01/30/2023 3:48:11 PM By: Allen Derry PA-C Entered By: Karie Schwalbe on 01/30/2023 10:35:16 -------------------------------------------------------------------------------- Problem List Details Patient Name: Date of Service: Raymond Camacho, Raymond Bering RD N. 01/30/2023 9:45 A M Medical Record Number: [id number] Patient Account Number: [id number] Date of Birth/Sex: Treating RN: 1940/12/04 (82 y.o. M) Primary Care Provider: Sondra Come Other Clinician: Referring Provider: Treating Provider/Extender: Jethro Bastos in Treatment: 2 Active Problems ICD-10 Encounter Code Description Active Date MDM Diagnosis [phone] Laceration without foreign body of left hand, initial encounter 01/16/2023 No Yes I10 Essential (primary) hypertension 01/16/2023 No Yes Inactive Problems Resolved Problems Electronic Signature(s) Signed: 01/30/2023 10:02:04 AM By: Allen Derry PA-C Entered By: Allen Derry on 01/30/2023 10:02:04 -------------------------------------------------------------------------------- Progress Note Details Patient Name: Date of Service: Raymond Burn RD N. 01/30/2023 9:45 A M Medical Record Number: [id number] Patient Account Number: [id number] Date of Birth/Sex: Treating RN: 05-28-1940 (82 y.o. M) Primary Care Provider: Sondra Come Other Clinician: Referring Provider: Treating Provider/Extender: Jethro Bastos in Treatment: 2 Subjective Chief Complaint Raymond Camacho, Raymond Camacho ([id number])  [id number] Page 4 of 5 Information obtained from Patient Left hand laceration History of Present Illness (HPI) 01-16-2023 upon evaluation today patient presents for initial inspection here in our clinic concerning issue that happened about 3 weeks ago. 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 Overall I think that he is making excellent progress towards complete closure. Electronic Signature(s) Signed: 01/30/2023 10:36:31 AM By: Allen Derry PA-C Entered By: Allen Derry on 01/30/2023 10:36:31 -------------------------------------------------------------------------------- Physician Orders Details Patient Name: Date of Service: Raymond Burn RD N. 01/30/2023 9:45 A M Medical Record Number: [id number] Patient Account Number: [id number] Date of Birth/Sex: Treating RN: Oct 23, 1940 (82 y.o. Dianna Limbo Primary Care Provider: Sondra Come Other Clinician: Referring Provider: Treating Provider/Extender: Jethro Bastos in Treatment: 2 Verbal / Phone Orders: No Diagnosis Coding ICD-10 Coding Code Description [phone] Laceration without foreign body of left hand, initial encounter I10 Essential (primary) hypertension Follow-up Appointments ppointment in 2 weeks. Leonard Schwartz Wednesday Room 9 02/13/23 at 9AM (please ask front desk for appointments) Return A Return appointment in 1 month. - Please ask front desk for appointment Other: - Pick up over the counter waterproof Nexcare bandage knee and elbow to hold your dressing in place. Check out [employer]. Anesthetic (In clinic) Topical Lidocaine 4% applied to wound bed Bathing/ Shower/ Hygiene May shower and wash wound with soap and water. Wound Treatment Wound #1 - Hand - Web Between 2nd and 3rd Digit Wound Laterality: Left, Anterior Cleanser: Soap and Water 1 x Per Day/30 Days Discharge Instructions: May shower and wash wound with dial antibacterial soap and water prior to dressing change. Peri-Wound Care: Gentell AandD+E Ointment, Foil  Pouch, 5 (g) 1 x Per Day/30 Days Raymond Camacho, Raymond Camacho ([id number]) [id number] Page 3 of 5 Prim Dressing: Hydrofera Blue Ready Transfer Foam, 2.5x2.5 (in/in) 1 x Per Day/30 Days ary Discharge Instructions: Apply directly to wound bed as directed Secondary Dressing: bandaid 1 x Per Day/30 Days Discharge Instructions: apply over the primary dressing. Electronic Signature(s) Signed: 01/30/2023 3:13:57 PM By: Karie Schwalbe RN Signed: 01/30/2023 3:48:11 PM By: Allen Derry PA-C Entered By: Karie Schwalbe on 01/30/2023 10:35:16 -------------------------------------------------------------------------------- Problem List Details Patient Name: Date of Service: Raymond Camacho, Raymond Bering RD N. 01/30/2023 9:45 A M Medical Record Number: [id number] Patient Account Number: [id number] Date of Birth/Sex: Treating RN: 1940/12/04 (82 y.o. M) Primary Care Provider: Sondra Come Other Clinician: Referring Provider: Treating Provider/Extender: Jethro Bastos in Treatment: 2 Active Problems ICD-10 Encounter Code Description Active Date MDM Diagnosis [phone] Laceration without foreign body of left hand, initial encounter 01/16/2023 No Yes I10 Essential (primary) hypertension 01/16/2023 No Yes Inactive Problems Resolved Problems Electronic Signature(s) Signed: 01/30/2023 10:02:04 AM By: Allen Derry PA-C Entered By: Allen Derry on 01/30/2023 10:02:04 -------------------------------------------------------------------------------- Progress Note Details Patient Name: Date of Service: Raymond Burn RD N. 01/30/2023 9:45 A M Medical Record Number: [id number] Patient Account Number: [id number] Date of Birth/Sex: Treating RN: 05-28-1940 (82 y.o. M) Primary Care Provider: Sondra Come Other Clinician: Referring Provider: Treating Provider/Extender: Jethro Bastos in Treatment: 2 Subjective Chief Complaint Raymond Camacho, Raymond Camacho ([id number])  [id number] Page 4 of 5 Information obtained from Patient Left hand laceration History of Present Illness (HPI) 01-16-2023 upon evaluation today patient presents for initial inspection here in our clinic concerning issue that happened about 3 weeks ago. 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 01/30/2023 9:45 A M Medical Record Number: [id number] Patient Account Number: [id number] Date of Birth/Sex: Treating RN: December 26, 1940 (82 y.o. M) Primary Care Provider: Sondra Come Other Clinician: Referring Provider: Treating Provider/Extender: Jethro Bastos in Treatment: 2 Information Obtained from: Patient Chief Complaint Left hand laceration Electronic Signature(s) Signed: 01/30/2023 10:25:54 AM By: Allen Derry PA-C Entered By: Allen Derry on 01/30/2023 10:25:54 -------------------------------------------------------------------------------- HPI Details Patient Name: Date of Service: Raymond Camacho, Raymond Bering RD N. 01/30/2023 9:45 A M Medical Record Number: [id number] Patient Account Number: [id number] Date of Birth/Sex: Treating RN: 1940/07/12 (82 y.o. M) Primary Care Provider: Sondra Come Other Clinician: Referring Provider: Treating Provider/Extender: Jethro Bastos in Treatment: 2 History of Present Illness HPI Description: 01-16-2023 upon evaluation today patient presents for initial inspection here in our clinic concerning issue that happened about 3 weeks ago. 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 Subsequently since that time he did show me the picture the tendon was not completely exposed but it was very close to you could actually see movement underneath and he is actually had good granulation tissue overgrowing this which is excellent news. Fortunately I do not see any signs of active infection locally or systemically which is great news. No fevers, chills,  nausea, vomiting, or diarrhea. Patient does have a history of hypertension but no other major medical problems. He has been using a Band-Aid with Hibiclens and was on doxycycline. He has not had any specific wound care measures performed at this point. 01-23-2023 upon evaluation today patient appears to be doing well currently in regard to his wound. He is actually showing signs of improvement though I think it may be a little bit moist with the Xeroform gauze dressing I think we may want to make a switch here. 01-30-2023 upon evaluation today patient appears to be doing excellent in regard to healing at this point. Fortunately I do not see any signs of active infection locally or systemically at this time which is great news and in general I do believe that we are making good headway towards complete closure which is great news. With that being said I do believe that he is really showing signs of healing quite nicely and I think that the [facility] with the AandD ointment has done an excellent job thus far. Electronic Signature(s) Signed: 01/30/2023 10:36:13 AM By: Allen Derry PA-C Entered By: Allen Derry on 01/30/2023 10:36:13 Raymond Camacho ([id number]) [id number] Page 2 of 5 -------------------------------------------------------------------------------- Physical Exam Details Patient Name: Date of Service: Raymond Burn RD N. 01/30/2023 9:45 A M Medical Record Number: [id number] Patient Account Number: [id number] Date of Birth/Sex: Treating RN: 22-Jan-1941 (82 y.o. M) Primary Care Provider: Sondra Come Other Clinician: Referring Provider: Treating Provider/Extender: Jethro Bastos in Treatment: 2 Constitutional Well-nourished and well-hydrated in no acute distress. Respiratory normal breathing without difficulty. Psychiatric this patient is able to make decisions and demonstrates good insight into disease process. Alert and  Oriented x 3. pleasant and cooperative. Notes Patient's wound on the knuckle at this point is showing signs of really good improvement no

## 2023-01-30 NOTE — Progress Notes (Addendum)
Raymond Camacho ([id number]) [id number] Page 1 of 7 Visit Report for 01/30/2023 Arrival Information Details Patient Name: Date of Service: Raymond Camacho N. 01/30/2023 9:45 A M Medical Record Number: [id number] Patient Account Number: [id number] Date of Birth/Sex: Treating RN: Raymond Camacho[birth year] (82 y.o. Raymond Camacho Primary Care Raymond Camacho: Raymond Camacho Other Clinician: Referring Raymond Camacho: Treating Raymond Camacho/Extender: Raymond Camacho in Treatment: 2 Visit Information History Since Last Visit Added or deleted any medications: No Patient Arrived: Ambulatory Any new allergies or adverse reactions: No Arrival Time: 09:59 Had a fall or experienced change in No Accompanied By: self activities of daily living that may affect Transfer Assistance: None risk of falls: Patient Identification Verified: Yes Signs or symptoms of abuse/neglect since last visito No Patient Requires Transmission-Based Precautions: No Hospitalized since last visit: No Patient Has Alerts: No Implantable device outside of the clinic excluding No cellular tissue based products placed in the center since last visit: Has Dressing in Place as Prescribed: Yes Pain Present Now: Yes Electronic Signature(s) Signed: 01/30/2023 3:13:57 PM By: Raymond Schwalbe RN Entered By: Raymond Camacho on 01/30/2023 07:00:12 -------------------------------------------------------------------------------- Clinic Level of Care Assessment Details Patient Name: Date of Service: Raymond Camacho N. 01/30/2023 9:45 A M Medical Record Number: [id number] Patient Account Number: [id number] Date of Birth/Sex: Treating RN: Raymond Camacho[birth year] (82 y.o. M) Primary Care Raymond Camacho: Raymond Camacho Other Clinician: Referring Raymond Camacho: Treating Raymond Camacho/Extender: Raymond Camacho in Treatment: 2 Clinic Level of Care Assessment Items TOOL 4 Quantity Score []  - 0 Use when only an EandM is  performed on FOLLOW-UP visit ASSESSMENTS - Nursing Assessment / Reassessment X- 1 10 Reassessment of Co-morbidities (includes updates in patient status) X- 1 5 Reassessment of Adherence to Treatment Plan ASSESSMENTS - Wound and Skin A ssessment / Reassessment X - Simple Wound Assessment / Reassessment - one wound 1 5 []  - 0 Complex Wound Assessment / Reassessment - multiple wounds []  - 0 Dermatologic / Skin Assessment (not related to wound area) ASSESSMENTS - Focused Assessment []  - 0 Circumferential Edema Measurements - multi extremities []  - 0 Nutritional Assessment / Counseling / Intervention Raymond Camacho, Raymond Camacho ([id number]) [id number] Page 2 of 7 []  - 0 Lower Extremity Assessment (monofilament, tuning fork, pulses) []  - 0 Peripheral Arterial Disease Assessment (using hand held doppler) ASSESSMENTS - Ostomy and/or Continence Assessment and Care []  - 0 Incontinence Assessment and Management []  - 0 Ostomy Care Assessment and Management (repouching, etc.) PROCESS - Coordination of Care X - Simple Patient / Family Education for ongoing care 1 15 []  - 0 Complex (extensive) Patient / Family Education for ongoing care X- 1 10 Staff obtains Chiropractor, Records, T Results / Process Orders est []  - 0 Staff telephones HHA, Nursing Homes / Clarify orders / etc []  - 0 Routine Transfer to another Facility (non-emergent condition) []  - 0 Routine Hospital Admission (non-emergent condition) []  - 0 New Admissions / Manufacturing engineer / Ordering NPWT Apligraf, etc. , []  - 0 Emergency Hospital Admission (emergent condition) []  - 0 Simple Discharge Coordination []  - 0 Complex (extensive) Discharge Coordination PROCESS - Special Needs []  - 0 Pediatric / Minor Patient Management []  - 0 Isolation Patient Management []  - 0 Hearing / Language / Visual special needs []  - 0 Assessment of Community assistance (transportation, D/C planning, etc.) []  -  0 Additional assistance / Altered mentation []  - 0 Support Surface(s) Assessment (bed, cushion, seat, etc.) INTERVENTIONS - Wound Cleansing / Measurement X - Simple Wound Cleansing - one  wound 1 5 []  - 0 Complex Wound Cleansing - multiple wounds X- 1 5 Wound Imaging (photographs - any number of wounds) []  - 0 Wound Tracing (instead of photographs) X- 1 5 Simple Wound Measurement - one wound []  - 0 Complex Wound Measurement - multiple wounds INTERVENTIONS - Wound Dressings X - Small Wound Dressing one or multiple wounds 1 10 []  - 0 Medium Wound Dressing one or multiple wounds []  - 0 Large Wound Dressing one or multiple wounds []  - 0 Application of Medications - topical []  - 0 Application of Medications - injection INTERVENTIONS - Miscellaneous []  - 0 External ear exam []  - 0 Specimen Collection (cultures, biopsies, blood, body fluids, etc.) []  - 0 Specimen(s) / Culture(s) sent or taken to Lab for analysis []  - 0 Patient Transfer (multiple staff / Nurse, adult / Similar devices) []  - 0 Simple Staple / Suture removal (25 or less) []  - 0 Complex Staple / Suture removal (26 or more) []  - 0 Hypo / Hyperglycemic Management (close monitor of Blood Glucose) Raymond Camacho, Raymond Camacho ([id number]) [id number] Page 3 of 7 []  - 0 Ankle / Brachial Index (ABI) - do not check if billed separately X- 1 5 Vital Signs Has the patient been seen at the hospital within the last three years: Yes Total Score: 75 Level Of Care: New/Established - Level 2 Electronic Signature(s) Signed: 03/13/2023 3:29:29 PM By: Pearletha Alfred Entered By: Pearletha Alfred on 02/12/2023 08:45:15 -------------------------------------------------------------------------------- Encounter Discharge Information Details Patient Name: Date of Service: Raymond Camacho, Raymond Camacho Camacho N. 01/30/2023 9:45 A M Medical Record Number: [id number] Patient Account Number: [id number] Date of Birth/Sex: Treating RN: [name], Raymond Camacho  (82 y.o. Raymond Camacho Primary Care Dolan Xia: Raymond Camacho Other Clinician: Referring Chandlor Noecker: Treating Jaleigha Deane/Extender: Raymond Camacho in Treatment: 2 Encounter Discharge Information Items Discharge Condition: Stable Ambulatory Status: Ambulatory Discharge Destination: Home Transportation: Private Auto Accompanied By: self Schedule Follow-up Appointment: Yes Clinical Summary of Care: Patient Declined Electronic Signature(s) Signed: 01/30/2023 3:13:57 PM By: Raymond Schwalbe RN Entered By: Raymond Camacho on 01/30/2023 11:58:01 -------------------------------------------------------------------------------- Lower Extremity Assessment Details Patient Name: Date of Service: Raymond Camacho N. 01/30/2023 9:45 A M Medical Record Number: [id number] Patient Account Number: [id number] Date of Birth/Sex: Treating RN: [birth year]Raymond Camacho (82 y.o. Raymond Camacho Primary Care Madeleine Fenn: Raymond Camacho Other Clinician: Referring Banita Lehn: Treating Orene Abbasi/Extender: Raymond Camacho in Treatment: 2 Electronic Signature(s) Signed: 01/30/2023 3:13:57 PM By: Raymond Schwalbe RN Entered By: Raymond Camacho on 01/30/2023 07:06:16 -------------------------------------------------------------------------------- Multi-Disciplinary Care Plan Details Patient Name: Date of Service: Raymond Camacho, Raymond Camacho Camacho N. 01/30/2023 9:45 A M Medical Record Number: [id number] Patient Account Number: [id number] Raymond Camacho, Raymond Camacho ([id number]) [id number] Page 4 of 7 Date of Birth/Sex: Treating RN: [birth year], Raymond Camacho (82 y.o. Raymond Camacho Primary Care Liesl Simons: Other Clinician: Sondra Camacho Referring Zebastian Carico: Treating Eathon Valade/Extender: Raymond Camacho in Treatment: 2 Active Inactive Orientation to the Wound Care Program Nursing Diagnoses: Knowledge deficit related to the wound healing center program Goals: Patient/caregiver will  verbalize understanding of the Wound Healing Center Program Date Initiated: 01/16/2023 Target Resolution Date: 03/18/2023 Goal Status: Active Interventions: Provide education on orientation to the wound center Notes: Pain, Acute or Chronic Nursing Diagnoses: Pain, acute or chronic: actual or potential Potential alteration in comfort, pain Goals: Patient will verbalize adequate pain control and receive pain control interventions during procedures as needed Date Initiated: 01/16/2023 Target Resolution Date: 03/18/2023 Goal Status: Active Patient/caregiver will verbalize comfort level met Date Initiated: 01/16/2023  Target Resolution Date: 03/18/2023 Goal Status: Active Interventions: Encourage patient to take pain medications as prescribed Provide education on pain management Reposition patient for comfort Treatment Activities: Administer pain control measures as ordered : 01/16/2023 Notes: Wound/Skin Impairment Nursing Diagnoses: Knowledge deficit related to ulceration/compromised skin integrity Goals: Patient/caregiver will verbalize understanding of skin care regimen Date Initiated: 01/16/2023 Target Resolution Date: 03/18/2023 Goal Status: Active Interventions: Assess patient/caregiver ability to perform ulcer/skin care regimen upon admission and as needed Assess ulceration(s) every visit Provide education on ulcer and skin care Treatment Activities: Skin care regimen initiated : 01/16/2023 Topical wound management initiated : 01/16/2023 Notes: Electronic Signature(s) Signed: 01/30/2023 3:13:57 PM By: Raymond Schwalbe RN Entered By: Raymond Camacho on 01/30/2023 11:48:38 Raymond Camacho ([id number]) [id number] Page 5 of 7 -------------------------------------------------------------------------------- Pain Assessment Details Patient Name: Date of Service: Raymond Camacho N. 01/30/2023 9:45 A M Medical Record Number: [id number] Patient Account Number:  [id number] Date of Birth/Sex: Treating RN: Raymond Camacho, Raymond Camacho (82 y.o. Raymond Camacho Primary Care Rihaan Barrack: Raymond Camacho Other Clinician: Referring Cailen Mihalik: Treating Raed Schalk/Extender: Raymond Camacho in Treatment: 2 Active Problems Location of Pain Severity and Description of Pain Patient Has Paino No Site Locations Pain Management and Medication Current Pain Management: Electronic Signature(s) Signed: 01/30/2023 3:13:57 PM By: Raymond Schwalbe RN Entered By: Raymond Camacho on 01/30/2023 07:06:00 -------------------------------------------------------------------------------- Patient/Caregiver Education Details Patient Name: Date of Service: Raymond Camacho N. 9/18/2024andnbsp9:45 A M Medical Record Number: [id number] Patient Account Number: [id number] Date of Birth/Gender: Treating RN: [birth year]Raymond Camacho (82 y.o. Raymond Camacho Primary Care Physician: Raymond Camacho Other Clinician: Referring Physician: Treating Physician/Extender: Raymond Camacho in Treatment: 2 Education Assessment Education Provided To: Patient Education Topics Provided Wound/Skin Impairment: Methods: Explain/Verbal Responses: State content correctly Raymond Camacho, Raymond Camacho ([id number]) [id number] Page 6 of 7 Electronic Signature(s) Signed: 01/30/2023 3:13:57 PM By: Raymond Schwalbe RN Entered By: Raymond Camacho on 01/30/2023 11:57:23 -------------------------------------------------------------------------------- Wound Assessment Details Patient Name: Date of Service: Raymond Camacho N. 01/30/2023 9:45 A M Medical Record Number: [id number] Patient Account Number: [id number] Date of Birth/Sex: Treating RN: Raymond Camacho[birth year] (82 y.o. Raymond Camacho Primary Care Cailan Antonucci: Raymond Camacho Other Clinician: Referring Danella Philson: Treating Gyselle Matthew/Extender: Raymond Camacho in Treatment: 2 Wound Status Wound Number: 1 Primary Skin  Tear Etiology: Wound Location: Left, Anterior Hand - Web Between 2nd and 3rd Digit Wound Status: Open Wounding Event: Laceration Comorbid Cataracts, Chronic sinus problems/congestion, Osteoarthritis, History: Neuropathy Date Acquired: 12/23/2022 Weeks Of Treatment: 2 Clustered Wound: No Photos Wound Measurements Length: (cm) 0.8 Width: (cm) 1 Depth: (cm) 0.1 Area: (cm) 0.628 Volume: (cm) 0.063 % Reduction in Area: 50% % Reduction in Volume: 50% Epithelialization: Small (1-33%) Tunneling: No Undermining: No Wound Description Classification: Full Thickness Without Exposed Suppor Wound Margin: Distinct, outline attached Exudate Amount: Medium Exudate Type: Serosanguineous Exudate Color: red, brown t Structures Foul Odor After Cleansing: No Slough/Fibrino No Wound Bed Granulation Amount: Medium (34-66%) Exposed Structure Granulation Quality: Red, Hyper-granulation Fascia Exposed: No Necrotic Amount: Medium (34-66%) Fat Layer (Subcutaneous Tissue) Exposed: Yes Necrotic Quality: Adherent Slough Tendon Exposed: No Muscle Exposed: No Joint Exposed: No Bone Exposed: No Periwound Skin Texture Texture Color No Abnormalities Noted: No No Abnormalities Noted: Yes Callus: No Temperature / Pain Crepitus: No Temperature: No Abnormality Raymond Camacho, Raymond Camacho ([id number]) [id number] Page 7 of 7 Excoriation: No Induration: No Rash: No Scarring: Yes Moisture No Abnormalities Noted: Yes Electronic Signature(s) Signed: 01/30/2023 3:13:57 PM By: Raymond Schwalbe RN Entered By: Raymond Camacho on  01/30/2023 07:13:31 -------------------------------------------------------------------------------- Vitals Details Patient Name: Date of Service: Raymond Camacho N. 01/30/2023 9:45 A M Medical Record Number: [id number] Patient Account Number: [id number] Date of Birth/Sex: Treating RN: 17-Raymond-Raymond Camacho (82 y.o. Raymond Camacho Primary Care Eutimio Gharibian: Raymond Camacho Other  Clinician: Referring Cutter Passey: Treating Aveyah Greenwood/Extender: Raymond Camacho in Treatment: 2 Vital Signs Time Taken: 09:59 Temperature (F): 98.1 Height (in): 71 Pulse (bpm): 73 Weight (lbs): 212 Respiratory Rate (breaths/min): 18 Body Mass Index (BMI): 29.6 Blood Pressure (mmHg): 158/81 Reference Range: 80 - 120 mg / dl Electronic Signature(s) Signed: 01/30/2023 3:13:57 PM By: Raymond Schwalbe RN Entered By: Raymond Camacho on 01/30/2023 07:05:22

## 2023-02-06 ENCOUNTER — Ambulatory Visit (HOSPITAL_BASED_OUTPATIENT_CLINIC_OR_DEPARTMENT_OTHER): Payer: No Typology Code available for payment source | Admitting: Physician Assistant

## 2023-02-13 ENCOUNTER — Encounter (HOSPITAL_BASED_OUTPATIENT_CLINIC_OR_DEPARTMENT_OTHER): Payer: No Typology Code available for payment source | Attending: Physician Assistant | Admitting: Physician Assistant

## 2023-02-13 DIAGNOSIS — I10 Essential (primary) hypertension: Secondary | ICD-10-CM | POA: Diagnosis not present

## 2023-02-13 DIAGNOSIS — S61412A Laceration without foreign body of left hand, initial encounter: Secondary | ICD-10-CM | POA: Insufficient documentation

## 2023-02-13 DIAGNOSIS — X58XXXA Exposure to other specified factors, initial encounter: Secondary | ICD-10-CM | POA: Insufficient documentation

## 2023-02-13 NOTE — Progress Notes (Signed)
in Treatment: 4 Constitutional Well-nourished and well-hydrated in no acute distress. Respiratory normal breathing without difficulty. Psychiatric this patient is able to make decisions and demonstrates good insight into disease process. Alert and Oriented x 3. pleasant and cooperative. Notes Upon evaluation patient's wound actually appears to be completely healed which is great news and in general I actually feel like that he is doing quite well. Electronic Signature(s) Signed: 02/13/2023 10:03:49 AM By: Raymond Derry PA-C Entered By: Raymond Camacho on 02/13/2023 07:03:49 -------------------------------------------------------------------------------- Physician Orders Details Patient Name: Date of Service: Raymond Camacho, Raymond Camacho RD N. 02/13/2023 9:00 A M Medical Record Number: [id number] Patient Account Number: [id number] Date of Birth/Sex: Treating RN: 1940/09/22 (82 y.o. Dianna Limbo Primary Care Provider: Sondra Come Other Clinician: Referring Provider: Treating Provider/Extender: Raymond Camacho in Treatment: 4 Verbal / Phone Orders: No Diagnosis Coding ICD-10 Coding Code Description [phone] Laceration without foreign body of left hand, initial encounter I10 Essential (primary) hypertension Discharge From [facility] Discharge from [facility] - Congratulations! Your wound is healed. Additional Orders / Instructions Other: - Keep using A and D ointment. Wound Treatment Electronic Signature(s) Signed: 02/13/2023 11:17:56 AM By: Karie Schwalbe RN Signed: 02/13/2023 5:35:47 PM By: Raymond Derry PA-C Entered By: Karie Schwalbe on 02/13/2023 06:35:51 Raymond Camacho ([id number]) [id number] Page 3 of 5 -------------------------------------------------------------------------------- Problem List Details Patient Name: Date of Service: [name] RD N.  02/13/2023 9:00 A M Medical Record Number: [id number] Patient Account Number: [id number] Date of Birth/Sex: Treating RN: April 15, 1941 (82 y.o. M) Primary Care Provider: Sondra Come Other Clinician: Referring Provider: Treating Provider/Extender: Raymond Camacho in Treatment: 4 Active Problems ICD-10 Encounter Code Description Active Date MDM Diagnosis [phone] Laceration without foreign body of left hand, initial encounter 01/16/2023 No Yes I10 Essential (primary) hypertension 01/16/2023 No Yes Inactive Problems Resolved Problems Electronic Signature(s) Signed: 02/13/2023 9:22:11 AM By: Raymond Derry PA-C Entered By: Raymond Camacho on 02/13/2023 06:22:11 -------------------------------------------------------------------------------- Progress Note Details Patient Name: Date of Service: [name] RD N. 02/13/2023 9:00 A M Medical Record Number: [id number] Patient Account Number: [id number] Date of Birth/Sex: Treating RN: [birth year] (82 y.o. M) Primary Care Provider: Sondra Come Other Clinician: Referring Provider: Treating Provider/Extender: Raymond Camacho in Treatment: 4 Subjective Chief Complaint Information obtained from Patient Left hand laceration History of Present Illness (HPI) 01-16-2023 upon evaluation today patient presents for initial inspection here in our clinic concerning issue that happened about 3 weeks ago. 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 Subsequently since that time he did show me the picture the tendon was not completely exposed but it was very close to you could actually see movement underneath and he is actually had good granulation tissue overgrowing this which is excellent news. Fortunately I do not see any signs of active  infection locally or systemically which is great news. No fevers, chills, nausea, vomiting, or diarrhea. Patient does have a history of hypertension but no other major medical problems. He has been using a Band-Aid with Hibiclens and was on doxycycline. He has not had any specific wound care measures performed at this point. 01-23-2023 upon evaluation today patient appears to be doing well currently in regard to his wound. He is actually showing signs of  Raymond Camacho ([id number]) [id number] Page 1 of 5 Visit Report for 02/13/2023 Chief Complaint Document Details Patient Name: Date of Service: [name] RD N. 02/13/2023 9:00 A M Medical Record Number: [id number] Patient Account Number: [id number] Date of Birth/Sex: Treating RN: 06-21-1940 (82 y.o. M) Primary Care Provider: Sondra Come Other Clinician: Referring Provider: Treating Provider/Extender: Raymond Camacho in Treatment: 4 Information Obtained from: Patient Chief Complaint Left hand laceration Electronic Signature(s) Signed: 02/13/2023 9:22:21 AM By: Raymond Derry PA-C Entered By: Raymond Camacho on 02/13/2023 06:22:20 -------------------------------------------------------------------------------- HPI Details Patient Name: Date of Service: Raymond Camacho, Raymond Camacho RD N. 02/13/2023 9:00 A M Medical Record Number: [id number] Patient Account Number: [id number] Date of Birth/Sex: Treating RN: 29-Jul-1940 (82 y.o. M) Primary Care Provider: Sondra Come Other Clinician: Referring Provider: Treating Provider/Extender: Raymond Camacho in Treatment: 4 History of Present Illness HPI Description: 01-16-2023 upon evaluation today patient presents for initial inspection here in our clinic concerning issue that happened about 3 weeks ago. 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 Subsequently since that time he did show me the picture the tendon was not completely exposed but it was very close to you could actually see movement underneath and he is actually had good granulation tissue overgrowing this which is excellent news. Fortunately I do not see any signs of active infection locally or systemically which is great news. No fevers, chills,  nausea, vomiting, or diarrhea. Patient does have a history of hypertension but no other major medical problems. He has been using a Band-Aid with Hibiclens and was on doxycycline. He has not had any specific wound care measures performed at this point. 01-23-2023 upon evaluation today patient appears to be doing well currently in regard to his wound. He is actually showing signs of improvement though I think it may be a little bit moist with the Xeroform gauze dressing I think we may want to make a switch here. 01-30-2023 upon evaluation today patient appears to be doing excellent in regard to healing at this point. Fortunately I do not see any signs of active infection locally or systemically at this time which is great news and in general I do believe that we are making good headway towards complete closure which is great news. With that being said I do believe that he is really showing signs of healing quite nicely and I think that the [facility] with the AandD ointment has done an excellent job thus far. 10-to-24 upon evaluation today patient appears to be doing excellent in fact he appears to be completely healed based on what I am seeing currently. I do not see any signs of active infection locally or systemically which is great news and in general I do believe that we are making good headway towards complete closure. Electronic Signature(s) Signed: 02/13/2023 10:03:00 AM By: Raymond Derry PA-C Entered By: Raymond Camacho on 02/13/2023 07:03:00 Raymond Camacho ([id number]) [id number] Page 2 of 5 -------------------------------------------------------------------------------- Physical Exam Details Patient Name: Date of Service: [name] RD N. 02/13/2023 9:00 A M Medical Record Number: [id number] Patient Account Number: [id number] Date of Birth/Sex: Treating RN: 08-08-1940 (82 y.o. M) Primary Care Provider: Sondra Come Other Clinician: Referring  Provider: Treating Provider/Extender: Raymond Camacho  in Treatment: 4 Constitutional Well-nourished and well-hydrated in no acute distress. Respiratory normal breathing without difficulty. Psychiatric this patient is able to make decisions and demonstrates good insight into disease process. Alert and Oriented x 3. pleasant and cooperative. Notes Upon evaluation patient's wound actually appears to be completely healed which is great news and in general I actually feel like that he is doing quite well. Electronic Signature(s) Signed: 02/13/2023 10:03:49 AM By: Raymond Derry PA-C Entered By: Raymond Camacho on 02/13/2023 07:03:49 -------------------------------------------------------------------------------- Physician Orders Details Patient Name: Date of Service: Raymond Camacho, Raymond Camacho RD N. 02/13/2023 9:00 A M Medical Record Number: [id number] Patient Account Number: [id number] Date of Birth/Sex: Treating RN: 1940/09/22 (82 y.o. Dianna Limbo Primary Care Provider: Sondra Come Other Clinician: Referring Provider: Treating Provider/Extender: Raymond Camacho in Treatment: 4 Verbal / Phone Orders: No Diagnosis Coding ICD-10 Coding Code Description [phone] Laceration without foreign body of left hand, initial encounter I10 Essential (primary) hypertension Discharge From [facility] Discharge from W[facility] Care Center - Congratulations! Your wound is healed. Additional Orders / Instructions Other: - Keep using A and D ointment. Wound Treatment Electronic Signature(s) Signed: 02/13/2023 11:17:56 AM By: Karie Schwalbe RN Signed: 02/13/2023 5:35:47 PM By: Raymond Derry PA-C Entered By: Karie Schwalbe on 02/13/2023 06:35:51 Raymond Camacho ([id number]) [id number] Page 3 of 5 -------------------------------------------------------------------------------- Problem List Details Patient Name: Date of Service: [name] RD N.  02/13/2023 9:00 A M Medical Record Number: [id number] Patient Account Number: [id number] Date of Birth/Sex: Treating RN: April 15, 1941 (82 y.o. M) Primary Care Provider: Sondra Come Other Clinician: Referring Provider: Treating Provider/Extender: Raymond Camacho in Treatment: 4 Active Problems ICD-10 Encounter Code Description Active Date MDM Diagnosis [phone] Laceration without foreign body of left hand, initial encounter 01/16/2023 No Yes I10 Essential (primary) hypertension 01/16/2023 No Yes Inactive Problems Resolved Problems Electronic Signature(s) Signed: 02/13/2023 9:22:11 AM By: Raymond Derry PA-C Entered By: Raymond Camacho on 02/13/2023 06:22:11 -------------------------------------------------------------------------------- Progress Note Details Patient Name: Date of Service: [name] RD N. 02/13/2023 9:00 A M Medical Record Number: [id number] Patient Account Number: [id number] Date of Birth/Sex: Treating RN: [birth year] (82 y.o. M) Primary Care Provider: Sondra Come Other Clinician: Referring Provider: Treating Provider/Extender: Raymond Camacho in Treatment: 4 Subjective Chief Complaint Information obtained from Patient Left hand laceration History of Present Illness (HPI) 01-16-2023 upon evaluation today patient presents for initial inspection here in our clinic concerning issue that happened about 3 weeks ago. 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 Subsequently since that time he did show me the picture the tendon was not completely exposed but it was very close to you could actually see movement underneath and he is actually had good granulation tissue overgrowing this which is excellent news. Fortunately I do not see any signs of active  infection locally or systemically which is great news. No fevers, chills, nausea, vomiting, or diarrhea. Patient does have a history of hypertension but no other major medical problems. He has been using a Band-Aid with Hibiclens and was on doxycycline. He has not had any specific wound care measures performed at this point. 01-23-2023 upon evaluation today patient appears to be doing well currently in regard to his wound. He is actually showing signs of  SuperBill Details Patient Name: Date of Service: [name] RD N. 02/13/2023 Medical Record Number: [id number] Patient Account Number: [id number] Date of Birth/Sex: Treating RN: Aug 02, 1940 (82 y.o. Dianna Limbo Primary Care Provider: Sondra Come Other Clinician: Referring Provider: Treating Provider/Extender: Raymond Camacho in Treatment: 4 Diagnosis Coding ICD-10 Codes Code Description [phone] Laceration without foreign body of left hand, initial encounter I10 Essential (primary) hypertension Facility Procedures : CPT4 Code: [id number] Description: 99213 - WOUND CARE VISIT-LEV 3 EST PT Modifier: Quantity: 1 Physician Procedures : CPT4 Code Description Modifier [id number] 99213 - WC PHYS LEVEL 3 - EST PT ICD-10 Diagnosis Description [id number] Laceration without foreign body of left hand, initial encounter I10 Essential (primary) hypertension Quantity: 1 Electronic Signature(s) Signed: 02/13/2023 10:04:55 AM By: Raymond Derry PA-C Entered By: Raymond Camacho on 02/13/2023 07:04:55

## 2023-02-14 NOTE — Progress Notes (Signed)
No Site Locations Pain Management and Medication Current Pain Management: Electronic Signature(s) Signed: 02/14/2023 3:10:19 PM By: Karie Schwalbe RN Signed: 02/14/2023 3:52:59 PM By: Thayer Dallas Entered By: Thayer Dallas on 02/13/2023 06:10:45 -------------------------------------------------------------------------------- Patient/Caregiver Education Details Patient Name: Date of Service: Raymond Burn RD N. 10/2/2024andnbsp9:00 A Raymond Camacho, Raymond Camacho ([id number]) [id number] Page 5 of 6 Medical Record Number: [id number] Patient Account Number: [id number] Date of Birth/Gender: Treating RN: 1941/04/30 (81 y.o. Raymond Camacho: Sondra Come Other Clinician: Referring Camacho: Treating Camacho/Extender: Jethro Bastos in Treatment: 4 Education Assessment Education Provided To: Patient Education Topics Provided Wound/Skin Impairment: Methods: Explain/Verbal Responses: State content correctly Electronic Signature(s) Signed: 02/13/2023 11:17:56 AM By: Karie Schwalbe RN Entered By: Karie Schwalbe on 02/13/2023 06:36:37 -------------------------------------------------------------------------------- Wound Assessment Details Patient Name: Date of  Service: Raymond Burn RD N. 02/13/2023 9:00 A M Medical Record Number: [id number] Patient Account Number: [id number] Date of Birth/Sex: Treating RN: Sep 02, 1940 (82 y.o. M) Primary Care Raymond Camacho: Sondra Come Other Clinician: Referring Raymond Camacho: Treating Raymond Camacho/Extender: Jethro Bastos in Treatment: 4 Wound Status Wound Number: 1 Primary Skin Tear Etiology: Wound Location: Left, Anterior Hand - Web Between 2nd and 3rd Digit Wound Status: Healed - Epithelialized Wounding Event: Laceration Comorbid Cataracts, Chronic sinus problems/congestion, Osteoarthritis, History: Neuropathy Date Acquired: 12/23/2022 Weeks Of Treatment: 4 Clustered Wound: No Photos Wound Measurements Length: (cm) Width: (cm) Depth: (cm) Area: (cm) Volume: (cm) 0 % Reduction in Area: 100% 0 % Reduction in Volume: 100% 0 Epithelialization: Large (67-100%) 0 Tunneling: No 0 Undermining: No Wound Description Classification: Full Thickness Without Exposed Support Structures Wound Margin: Distinct, outline attached Exudate Amount: None Present Raymond Camacho, Raymond Camacho ([id number]) Foul Odor After Cleansing: No Slough/Fibrino No [id number].pdf Page 6 of 6 Wound Bed Granulation Amount: None Present (0%) Exposed Structure Necrotic Amount: None Present (0%) Fascia Exposed: No Fat Layer (Subcutaneous Tissue) Exposed: No Tendon Exposed: No Muscle Exposed: No Joint Exposed: No Bone Exposed: No Periwound Skin Texture Texture Color No Abnormalities Noted: No No Abnormalities Noted: Yes Callus: No Temperature / Pain Crepitus: No Temperature: No Abnormality Excoriation: No Induration: No Rash: No Scarring: No Moisture No Abnormalities Noted: Yes Electronic Signature(s) Signed: 02/13/2023 11:17:56 AM By: Karie Schwalbe RN Entered By: Karie Schwalbe on 02/13/2023 06:38:48 -------------------------------------------------------------------------------- Vitals  Details Patient Name: Date of Service: Raymond Camacho, Raymond Camacho RD N. 02/13/2023 9:00 A M Medical Record Number: [id number] Patient Account Number: [id number] Date of Birth/Sex: Treating RN: Feb 02, 1941 (82 y.o. M) Primary Care Raymond Camacho: Sondra Come Other Clinician: Referring Alawna Graybeal: Treating Amiliah Campisi/Extender: Jethro Bastos in Treatment: 4 Vital Signs Time Taken: 09:10 Temperature (F): 98 Height (in): 71 Pulse (bpm): 76 Weight (lbs): 212 Respiratory Rate (breaths/min): 18 Body Mass Index (BMI): 29.6 Blood Pressure (mmHg): 152/88 Reference Range: 80 - 120 mg / dl Electronic Signature(s) Signed: 02/14/2023 3:10:19 PM By: Karie Schwalbe RN Signed: 02/14/2023 3:52:59 PM By: Thayer Dallas Entered By: Thayer Dallas on 02/13/2023 06:10:38  Raymond Camacho, Raymond Camacho ([id number]) [id number] Page 1 of 6 Visit Report for 02/13/2023 Arrival Information Details Patient Name: Date of Service: Raymond Burn RD N. 02/13/2023 9:00 A M Medical Record Number: [id number] Patient Account Number: [id number] Date of Birth/Sex: Treating RN: [birth year] (82 y.o. M) Primary Care Lorren Rossetti: Sondra Come Other Clinician: Referring Iara Monds: Treating Raymond Camacho/Extender: Jethro Bastos in Treatment: 4 Visit Information History Since Last Visit Added or deleted any medications: No Patient Arrived: Ambulatory Any new allergies or adverse reactions: No Arrival Time: 09:05 Had a fall or experienced change in No Accompanied By: self activities of daily living that may affect Transfer Assistance: None risk of falls: Patient Identification Verified: Yes Signs or symptoms of abuse/neglect since last visito No Secondary Verification Process Completed: Yes Hospitalized since last visit: No Patient Requires Transmission-Based Precautions: No Implantable device outside of the clinic excluding No Patient Has Alerts: No cellular tissue based products placed in the center since last visit: Has Dressing in Place as Prescribed: Yes Pain Present Now: No Electronic Signature(s) Signed: 02/14/2023 3:10:19 PM By: Karie Schwalbe RN Signed: 02/14/2023 3:52:59 PM By: Thayer Dallas Entered By: Thayer Dallas on 02/13/2023 06:10:15 -------------------------------------------------------------------------------- Clinic Level of Care Assessment Details Patient Name: Date of Service: Raymond Camacho, Raymond Camacho RD N. 02/13/2023 9:00 A M Medical Record Number: [id number] Patient Account Number: [id number] Date of Birth/Sex: Treating RN: [birth year] (82 y.o. Raymond Camacho Primary Care Heavenlee Maiorana: Sondra Come Other Clinician: Referring Sheretha Shadd: Treating Daemien Fronczak/Extender: Jethro Bastos in Treatment: 4 Clinic  Level of Care Assessment Items TOOL 4 Quantity Score X- 1 0 Use when only an EandM is performed on FOLLOW-UP visit ASSESSMENTS - Nursing Assessment / Reassessment X- 1 10 Reassessment of Co-morbidities (includes updates in patient status) X- 1 5 Reassessment of Adherence to Treatment Plan ASSESSMENTS - Wound and Skin A ssessment / Reassessment X - Simple Wound Assessment / Reassessment - one wound 1 5 []  - 0 Complex Wound Assessment / Reassessment - multiple wounds []  - 0 Dermatologic / Skin Assessment (not related to wound area) ASSESSMENTS - Focused Assessment []  - 0 Circumferential Edema Measurements - multi extremities []  - 0 Nutritional Assessment / Counseling / Intervention RIVERS, HAMRICK ([id number]) [id number] Page 2 of 6 []  - 0 Lower Extremity Assessment (monofilament, tuning fork, pulses) []  - 0 Peripheral Arterial Disease Assessment (using hand held doppler) ASSESSMENTS - Ostomy and/or Continence Assessment and Care []  - 0 Incontinence Assessment and Management []  - 0 Ostomy Care Assessment and Management (repouching, etc.) PROCESS - Coordination of Care X - Simple Patient / Family Education for ongoing care 1 15 []  - 0 Complex (extensive) Patient / Family Education for ongoing care X- 1 10 Staff obtains Chiropractor, Records, T Results / Process Orders est X- 1 10 Staff telephones HHA, Nursing Homes / Clarify orders / etc []  - 0 Routine Transfer to another Facility (non-emergent condition) []  - 0 Routine Hospital Admission (non-emergent condition) []  - 0 New Admissions / Manufacturing engineer / Ordering NPWT Apligraf, etc. , []  - 0 Emergency Hospital Admission (emergent condition) X- 1 10 Simple Discharge Coordination []  - 0 Complex (extensive) Discharge Coordination PROCESS - Special Needs []  - 0 Pediatric / Minor Patient Management []  - 0 Isolation Patient Management []  - 0 Hearing / Language / Visual special  needs []  - 0 Assessment of Community assistance (transportation, D/C planning, etc.) []  - 0 Additional assistance / Altered mentation []  - 0 Support Surface(s) Assessment (bed, cushion, seat, etc.) INTERVENTIONS -  No Site Locations Pain Management and Medication Current Pain Management: Electronic Signature(s) Signed: 02/14/2023 3:10:19 PM By: Karie Schwalbe RN Signed: 02/14/2023 3:52:59 PM By: Thayer Dallas Entered By: Thayer Dallas on 02/13/2023 06:10:45 -------------------------------------------------------------------------------- Patient/Caregiver Education Details Patient Name: Date of Service: Raymond Burn RD N. 10/2/2024andnbsp9:00 A Raymond Camacho, Raymond Camacho ([id number]) [id number] Page 5 of 6 Medical Record Number: [id number] Patient Account Number: [id number] Date of Birth/Gender: Treating RN: 1941/04/30 (81 y.o. Raymond Camacho: Sondra Come Other Clinician: Referring Camacho: Treating Camacho/Extender: Jethro Bastos in Treatment: 4 Education Assessment Education Provided To: Patient Education Topics Provided Wound/Skin Impairment: Methods: Explain/Verbal Responses: State content correctly Electronic Signature(s) Signed: 02/13/2023 11:17:56 AM By: Karie Schwalbe RN Entered By: Karie Schwalbe on 02/13/2023 06:36:37 -------------------------------------------------------------------------------- Wound Assessment Details Patient Name: Date of  Service: Raymond Burn RD N. 02/13/2023 9:00 A M Medical Record Number: [id number] Patient Account Number: [id number] Date of Birth/Sex: Treating RN: Sep 02, 1940 (82 y.o. M) Primary Care Raymond Camacho: Sondra Come Other Clinician: Referring Raymond Camacho: Treating Raymond Camacho/Extender: Jethro Bastos in Treatment: 4 Wound Status Wound Number: 1 Primary Skin Tear Etiology: Wound Location: Left, Anterior Hand - Web Between 2nd and 3rd Digit Wound Status: Healed - Epithelialized Wounding Event: Laceration Comorbid Cataracts, Chronic sinus problems/congestion, Osteoarthritis, History: Neuropathy Date Acquired: 12/23/2022 Weeks Of Treatment: 4 Clustered Wound: No Photos Wound Measurements Length: (cm) Width: (cm) Depth: (cm) Area: (cm) Volume: (cm) 0 % Reduction in Area: 100% 0 % Reduction in Volume: 100% 0 Epithelialization: Large (67-100%) 0 Tunneling: No 0 Undermining: No Wound Description Classification: Full Thickness Without Exposed Support Structures Wound Margin: Distinct, outline attached Exudate Amount: None Present Raymond Camacho, Raymond Camacho ([id number]) Foul Odor After Cleansing: No Slough/Fibrino No [phone].pdf Page 6 of 6 Wound Bed Granulation Amount: None Present (0%) Exposed Structure Necrotic Amount: None Present (0%) Fascia Exposed: No Fat Layer (Subcutaneous Tissue) Exposed: No Tendon Exposed: No Muscle Exposed: No Joint Exposed: No Bone Exposed: No Periwound Skin Texture Texture Color No Abnormalities Noted: No No Abnormalities Noted: Yes Callus: No Temperature / Pain Crepitus: No Temperature: No Abnormality Excoriation: No Induration: No Rash: No Scarring: No Moisture No Abnormalities Noted: Yes Electronic Signature(s) Signed: 02/13/2023 11:17:56 AM By: Karie Schwalbe RN Entered By: Karie Schwalbe on 02/13/2023 06:38:48 -------------------------------------------------------------------------------- Vitals  Details Patient Name: Date of Service: Raymond Camacho, Raymond Camacho RD N. 02/13/2023 9:00 A M Medical Record Number: [id number] Patient Account Number: [id number] Date of Birth/Sex: Treating RN: Feb 02, 1941 (82 y.o. M) Primary Care Raymond Camacho: Sondra Come Other Clinician: Referring Alawna Graybeal: Treating Amiliah Campisi/Extender: Jethro Bastos in Treatment: 4 Vital Signs Time Taken: 09:10 Temperature (F): 98 Height (in): 71 Pulse (bpm): 76 Weight (lbs): 212 Respiratory Rate (breaths/min): 18 Body Mass Index (BMI): 29.6 Blood Pressure (mmHg): 152/88 Reference Range: 80 - 120 mg / dl Electronic Signature(s) Signed: 02/14/2023 3:10:19 PM By: Karie Schwalbe RN Signed: 02/14/2023 3:52:59 PM By: Thayer Dallas Entered By: Thayer Dallas on 02/13/2023 06:10:38

## 2023-02-27 ENCOUNTER — Ambulatory Visit (HOSPITAL_BASED_OUTPATIENT_CLINIC_OR_DEPARTMENT_OTHER): Payer: No Typology Code available for payment source | Admitting: Physician Assistant

## 2023-03-13 NOTE — Progress Notes (Signed)
within target range for patient.Marland Kitchen respirations regular, non-labored and within target range for patient.Marland Kitchen temperature within target range for patient.. Well-nourished and well-hydrated in no acute distress. Eyes conjunctiva clear no eyelid edema noted. pupils equal round and reactive to light and accommodation. Ears, Nose, Mouth, and Throat no gross abnormality of ear auricles or external auditory canals. normal hearing noted during conversation. mucus membranes moist. Respiratory normal breathing without difficulty. Musculoskeletal normal gait and posture. no significant deformity or arthritic changes, no loss or range of motion, no clubbing. Psychiatric this patient is able to make decisions and demonstrates good insight into disease process. Alert and Oriented x 3. pleasant and cooperative. Notes Fortunately I do not see any signs of active infection locally or systemically which is Excellent and in general I think that we are making good headway towards closure. I do believe that he has been doing very well I think that he may do well with Xeroform gauze due to the fact that this looks to be a little bit dry I would like to do something to try to keep this from drying out so much. He is in agreement with the plan we will give Xeroform to try and subsequently he will be using a waterproof bandage over top of this. Upon inspection patient's wound bed actually showed signs of good granulation epithelization at this point. Electronic Signature(s) Signed:  01/16/2023 11:04:35 AM By: Raymond Camacho, Raymond Camacho ([id number]) By: Raymond Camacho [phone].pdf Page 3 of 9 Signed: 01/16/2023 11:04:35 AM Entered By: Allen Derry on 01/16/2023 08:04:35 -------------------------------------------------------------------------------- Physician Orders Details Patient Name: Date of Service: Raymond Burn RD Camacho. 01/16/2023 9:30 A M Medical Record Number: [id number] Patient Account Number: [id number] Date of Birth/Sex: Treating RN: 09-12-1940 (82 y.o. Raymond Camacho Primary Care Provider: Sondra Camacho Other Clinician: Referring Provider: Treating Provider/Extender: Raymond Camacho in Treatment: 0 Verbal / Phone Orders: No Diagnosis Coding ICD-10 Coding Code Description [phone] Laceration without foreign body of left hand, initial encounter I10 Essential (primary) hypertension Follow-up Appointments ppointment in 1 week. Raymond Camacho Wednesday 01/23/2023 1230 room 7 Return A ppointment in 2 weeks. Raymond Camacho Wednesday 01/30/2023 0945 room 9 Return A Return appointment in 3 weeks. Raymond Camacho Wednesday room 9 02/06/2023 0945 Other: - Pick up over the counter waterproof Nexcare bandage knee and elbow to hold your dressing in place. Check out [employer]. Anesthetic (In clinic) Topical Lidocaine 4% applied to wound bed Bathing/ Shower/ Hygiene May shower and wash wound with soap and water. Wound Treatment Wound #1 - Hand - Web Between 2nd and 3rd Digit Wound Laterality: Left, Anterior Cleanser: Soap and Water 1 x Per Day/30 Days Discharge Instructions: May shower and wash wound with dial antibacterial soap and water prior to dressing change. Prim Dressing: Xeroform Occlusive Gauze Dressing, 4x4 in 1 x Per Day/30 Days ary Discharge Instructions: Apply to wound bed as instructed Secondary Dressing: bandaid 1 x Per Day/30 Days Discharge Instructions: apply over the primary dressing. Patient Medications llergies: Sulfa  (Sulfonamide Antibiotics) A Notifications Medication Indication Start End applied only in clinic for9/08/2022 lidocaine debridements. DOSE topical 4 % cream - cream topical once daily Electronic Signature(s) Signed: 01/16/2023 1:48:54 PM By: Allen Derry PA-C Signed: 01/16/2023 6:09:26 PM By: Shawn Stall RN, BSN Entered By: Shawn Stall on 01/16/2023 07:53:53 Raymond Camacho ([id number]) [id number] Page 4 of 9 -------------------------------------------------------------------------------- Problem List Details Patient Name: Date of Service: Raymond Burn RD Camacho. 01/16/2023 9:30 A M Medical Record Number:  within target range for patient.Marland Kitchen respirations regular, non-labored and within target range for patient.Marland Kitchen temperature within target range for patient.. Well-nourished and well-hydrated in no acute distress. Eyes conjunctiva clear no eyelid edema noted. pupils equal round and reactive to light and accommodation. Ears, Nose, Mouth, and Throat no gross abnormality of ear auricles or external auditory canals. normal hearing noted during conversation. mucus membranes moist. Respiratory normal breathing without difficulty. Musculoskeletal normal gait and posture. no significant deformity or arthritic changes, no loss or range of motion, no clubbing. Psychiatric this patient is able to make decisions and demonstrates good insight into disease process. Alert and Oriented x 3. pleasant and cooperative. Notes Fortunately I do not see any signs of active infection locally or systemically which is Excellent and in general I think that we are making good headway towards closure. I do believe that he has been doing very well I think that he may do well with Xeroform gauze due to the fact that this looks to be a little bit dry I would like to do something to try to keep this from drying out so much. He is in agreement with the plan we will give Xeroform to try and subsequently he will be using a waterproof bandage over top of this. Upon inspection patient's wound bed actually showed signs of good granulation epithelization at this point. Electronic Signature(s) Signed:  01/16/2023 11:04:35 AM By: Raymond Camacho, Raymond Camacho ([id number]) By: Raymond Camacho [phone].pdf Page 3 of 9 Signed: 01/16/2023 11:04:35 AM Entered By: Allen Derry on 01/16/2023 08:04:35 -------------------------------------------------------------------------------- Physician Orders Details Patient Name: Date of Service: Raymond Burn RD Camacho. 01/16/2023 9:30 A M Medical Record Number: [id number] Patient Account Number: [id number] Date of Birth/Sex: Treating RN: 09-12-1940 (82 y.o. Raymond Camacho Primary Care Provider: Sondra Camacho Other Clinician: Referring Provider: Treating Provider/Extender: Raymond Camacho in Treatment: 0 Verbal / Phone Orders: No Diagnosis Coding ICD-10 Coding Code Description [phone] Laceration without foreign body of left hand, initial encounter I10 Essential (primary) hypertension Follow-up Appointments ppointment in 1 week. Raymond Camacho Wednesday 01/23/2023 1230 room 7 Return A ppointment in 2 weeks. Raymond Camacho Wednesday 01/30/2023 0945 room 9 Return A Return appointment in 3 weeks. Raymond Camacho Wednesday room 9 02/06/2023 0945 Other: - Pick up over the counter waterproof Nexcare bandage knee and elbow to hold your dressing in place. Check out [employer]. Anesthetic (In clinic) Topical Lidocaine 4% applied to wound bed Bathing/ Shower/ Hygiene May shower and wash wound with soap and water. Wound Treatment Wound #1 - Hand - Web Between 2nd and 3rd Digit Wound Laterality: Left, Anterior Cleanser: Soap and Water 1 x Per Day/30 Days Discharge Instructions: May shower and wash wound with dial antibacterial soap and water prior to dressing change. Prim Dressing: Xeroform Occlusive Gauze Dressing, 4x4 in 1 x Per Day/30 Days ary Discharge Instructions: Apply to wound bed as instructed Secondary Dressing: bandaid 1 x Per Day/30 Days Discharge Instructions: apply over the primary dressing. Patient Medications llergies: Sulfa  (Sulfonamide Antibiotics) A Notifications Medication Indication Start End applied only in clinic for9/08/2022 lidocaine debridements. DOSE topical 4 % cream - cream topical once daily Electronic Signature(s) Signed: 01/16/2023 1:48:54 PM By: Allen Derry PA-C Signed: 01/16/2023 6:09:26 PM By: Shawn Stall RN, BSN Entered By: Shawn Stall on 01/16/2023 07:53:53 Raymond Camacho ([id number]) [id number] Page 4 of 9 -------------------------------------------------------------------------------- Problem List Details Patient Name: Date of Service: Raymond Burn RD Camacho. 01/16/2023 9:30 A M Medical Record Number:  [id number] Patient Account Number: [id number] Date of Birth/Sex: Treating RN: [birth year] (82 y.o. M) Primary Care Provider: Sondra Camacho Other Clinician: Referring Provider: Treating Provider/Extender: Raymond Camacho in Treatment: 0 Active Problems ICD-10 Encounter Code Description Active Date MDM Diagnosis [phone] Laceration without foreign body of left hand, initial encounter 01/16/2023 No Yes I10 Essential (primary) hypertension 01/16/2023 No Yes Inactive Problems Resolved Problems Electronic Signature(s) Signed: 01/16/2023 10:46:53 AM By: Allen Derry PA-C Entered By: Allen Derry on 01/16/2023 07:46:53 -------------------------------------------------------------------------------- Progress Note Details Patient Name: Date of Service: Raymond Burn RD Camacho. 01/16/2023 9:30 A M Medical Record Number: [id number] Patient Account Number: [id number] Date of Birth/Sex: Treating RN: March 03, 1941 (82 y.o. M) Primary Care Provider: Sondra Camacho Other Clinician: Referring Provider: Treating Provider/Extender: Raymond Camacho in Treatment: 0 Subjective Chief Complaint Information obtained from Patient Left hand laceration History of Present Illness (HPI) 01-16-2023 upon evaluation today patient presents for initial  inspection here in our clinic concerning issue that happened about 3 weeks ago. 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 Subsequently since that time he did show me the picture the tendon was not completely exposed but it was very close to you could actually see movement underneath and he is actually had good granulation tissue overgrowing this which is excellent news. Fortunately I do not see any signs of active infection locally or systemically which is great news. No fevers, chills, nausea, vomiting, or diarrhea. Patient does have a history of hypertension but no other major medical problems. He has been using a Band-Aid with Hibiclens and was on doxycycline. He has not had any specific wound care measures performed at this point. Patient History Information obtained from Patient. Allergies Sulfa (Sulfonamide Antibiotics) (Severity: Severe, Reaction: vomiting) Family History Lung Disease - Father, Stroke - Father, Tuberculosis - Maternal Grandparents, No family history of Cancer, Diabetes, Heart Disease, Hereditary Spherocytosis, Hypertension, Kidney Disease, Seizures, Thyroid Problems. Social History Former smoker - 45 years, Marital Status - Widowed, Alcohol Use - Rarely, Drug Use - No History, Caffeine Use - Daily. Raymond Camacho, Raymond Camacho ([id number]) [id number] Page 5 of 9 Medical History Eyes Patient has history of Cataracts Denies history of Glaucoma, Optic Neuritis Ear/Nose/Mouth/Throat Patient has history of Chronic sinus problems/congestion - seasonal Denies history of Middle ear problems Hematologic/Lymphatic Denies history of Anemia, Hemophilia, Human Immunodeficiency Virus, Lymphedema, Sickle Cell Disease Respiratory Denies history of Aspiration, Asthma, Chronic  Obstructive Pulmonary Disease (COPD), Pneumothorax, Sleep Apnea, Tuberculosis Cardiovascular Denies history of Angina, Arrhythmia, Congestive Heart Failure, Coronary Artery Disease, Deep Vein Thrombosis, Hypertension, Hypotension, Myocardial Infarction, Peripheral Arterial Disease, Peripheral Venous Disease, Phlebitis, Vasculitis Gastrointestinal Denies history of Cirrhosis , Colitis, Crohns, Hepatitis A, Hepatitis B, Hepatitis C Genitourinary Denies history of End Stage Renal Disease Immunological Denies history of Lupus Erythematosus, Raynauds, Scleroderma Integumentary (Skin) Denies history of History of Burn Musculoskeletal Patient has history of Osteoarthritis Denies history of Gout, Rheumatoid Arthritis, Osteomyelitis Neurologic Patient has history of Neuropathy Denies history of Dementia, Quadriplegia, Paraplegia, Seizure Disorder Oncologic Denies history of Received Chemotherapy, Received Radiation Psychiatric Denies history of Anorexia/bulimia, Confinement Anxiety Hospitalization/Surgery History - CVA 2019. Medical A Surgical History Notes nd Cardiovascular palpitations Review of Systems (ROS) Constitutional Symptoms (General Health) Denies complaints or symptoms of Fatigue, Fever, Chills, Marked Weight  Raymond Camacho, Raymond Camacho ([id number]) [id number] Page 1 of 9 Visit Report for 01/16/2023 Chief Complaint Document Details Patient Name: Date of Service: Raymond Burn RD Camacho. 01/16/2023 9:30 A M Medical Record Number: [id number] Patient Account Number: [id number] Date of Birth/Sex: Treating RN: 1940-07-15 (82 y.o. M) Primary Care Provider: Sondra Camacho Other Clinician: Referring Provider: Treating Provider/Extender: Raymond Camacho in Treatment: 0 Information Obtained from: Patient Chief Complaint Left hand laceration Electronic Signature(s) Signed: 01/16/2023 10:47:27 AM By: Allen Derry PA-C Entered By: Allen Derry on 01/16/2023 07:47:27 -------------------------------------------------------------------------------- Debridement Details Patient Name: Date of Service: Raymond Camacho, Raymond Camacho RD Camacho. 01/16/2023 9:30 A M Medical Record Number: [id number] Patient Account Number: [id number] Date of Birth/Sex: Treating RN: Jan 26, 1941 (82 y.o. Raymond Camacho Primary Care Provider: Sondra Camacho Other Clinician: Referring Provider: Treating Provider/Extender: Raymond Camacho in Treatment: 0 Debridement Performed for Assessment: Wound #1 Left,Anterior Hand - Web Between 2nd and 3rd Digit Performed By: Physician Lenda Kelp, PA Debridement Type: Chemical/Enzymatic/Mechanical Agent Used: gauze and wound cleanser Level of Consciousness (Pre-procedure): Awake and Alert Pre-procedure Verification/Time Out No Taken: Percent of Wound Bed Debrided: Bleeding: None Response to Treatment: Procedure was tolerated well Level of Consciousness (Post- Awake and Alert procedure): Post Debridement Measurements of Total Wound Length: (cm) 1 Width: (cm) 1.6 Depth: (cm) 0.1 Volume: (cm) 0.126 Character of Wound/Ulcer Post Debridement: Improved Post Procedure Diagnosis Same as Pre-procedure Electronic Signature(s) Signed: 01/16/2023 1:48:54 PM By:  Allen Derry PA-C Signed: 01/16/2023 6:09:26 PM By: Shawn Stall RN, BSN Entered By: Shawn Stall on 01/16/2023 07:49:56 Raymond Camacho ([id number]) [id number] Page 2 of 9 -------------------------------------------------------------------------------- HPI Details Patient Name: Date of Service: Raymond Burn RD Camacho. 01/16/2023 9:30 A M Medical Record Number: [id number] Patient Account Number: [id number] Date of Birth/Sex: Treating RN: 1940-08-12 (82 y.o. M) Primary Care Provider: Sondra Camacho Other Clinician: Referring Provider: Treating Provider/Extender: Raymond Camacho in Treatment: 0 History of Present Illness HPI Description: 01-16-2023 upon evaluation today patient presents for initial inspection here in our clinic concerning issue that happened about 3 weeks ago. 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 Subsequently since that time he did show me the picture the tendon was not completely exposed but it was very close to you could actually see movement underneath and he is actually had good granulation tissue overgrowing this which is excellent news. Fortunately I do not see any signs of active infection locally or systemically which is great news. No fevers, chills, nausea, vomiting, or diarrhea. Patient does have a history of hypertension but no other major medical problems. He has been using a Band-Aid with Hibiclens and was on doxycycline. He has not had any specific wound care measures performed at this point. Electronic Signature(s) Signed: 01/16/2023 11:03:28 AM By: Allen Derry PA-C Entered By: Allen Derry on 01/16/2023 08:03:28 -------------------------------------------------------------------------------- Physical Exam Details Patient Name:  Date of Service: Raymond Burn RD Camacho. 01/16/2023 9:30 A M Medical Record Number: [id number] Patient Account Number: [id number] Date of Birth/Sex: Treating RN: 11-11-1940 (82 y.o. M) Primary Care Provider: Sondra Camacho Other Clinician: Referring Provider: Treating Provider/Extender: Raymond Camacho in Treatment: 0 Constitutional sitting or standing blood pressure is within target range for patient.. pulse regular and  Raymond Camacho, Raymond Camacho, [place].. Other agent used was gauze and wound cleanser. There was no bleeding. The procedure was tolerated well. Post Debridement Measurements: 1cm length x 1.6cm width x 0.1cm depth; 0.126cm^3 volume. Character of Wound/Ulcer Post Debridement is improved. Post procedure Diagnosis Wound #1: Same as Pre-Procedure Plan Follow-up Appointments: Return Appointment in 1 week. Raymond Camacho Wednesday 01/23/2023 1230 room 7 Return Appointment in 2 weeks. Raymond Camacho Wednesday 01/30/2023 0945 room 9 Return appointment in 3 weeks. Raymond Camacho Wednesday room 9 02/06/2023 0945 Other: - Pick up over the counter waterproof Nex[employer] bandage knee and elbow to hold your dressing in place. Check out [employer]. Anesthetic: (In clinic) Topical Lidocaine 4% applied to wound bed Bathing/ Shower/ Hygiene: May shower and wash wound with soap and water. The following medication(s) was prescribed: lidocaine topical 4 % cream cream topical once daily for applied only in clinic for debridements. was prescribed at facility WOUND #1: - Hand - Web Between 2nd and 3rd Digit Wound Laterality: Left, Anterior Cleanser: Soap and Water 1 x Per Day/30 Days Discharge Instructions: May shower and wash wound with dial antibacterial soap and water prior to dressing change. Prim Dressing: Xeroform Occlusive Gauze Dressing, 4x4 in 1 x Per Day/30 Days ary Discharge Instructions: Apply to wound bed as instructed Secondary Dressing: bandaid 1 x Per Day/30 Days Discharge Instructions: apply over the primary dressing. 1. I would recommend going initiate treatment with a Xeroform gauze dressing patient is in agreement with plan. 2. I am going to recommend that we have the patient continue to monitor for any signs of infection or worsening. Based on what I am seeing I do believe that we will make an excellent headway towards closure here. I think Xeroform gauze dressing over top of the wound followed by waterproof bandage will be the way to go. We will see patient back for reevaluation in 1 week here in the clinic. If anything worsens or changes patient will contact our office for additional recommendations. Raymond Camacho, Raymond Camacho ([id number]) [id number] Page 7 of 9 Electronic Signature(s) Signed: 01/16/2023 11:05:06 AM By: Allen Derry PA-C Entered By: Allen Derry on 01/16/2023 08:05:06 -------------------------------------------------------------------------------- HxROS  Details Patient Name: Date of Service: Raymond Camacho, Raymond Camacho RD Camacho. 01/16/2023 9:30 A M Medical Record Number: [id number] Patient Account Number: [id number] Date of Birth/Sex: Treating RN: 03-30-1941 (82 y.o. M) Primary Care Provider: Sondra Camacho Other Clinician: Referring Provider: Treating Provider/Extender: Raymond Camacho in Treatment: 0 Information Obtained From Patient Constitutional Symptoms (General Health) Complaints and Symptoms: Negative for: Fatigue; Fever; Chills; Marked Weight Change Eyes Complaints and Symptoms: Positive for: Glasses / Contacts Negative for: Dry Eyes; Vision Changes Medical History: Positive for: Cataracts Negative for: Glaucoma; Optic Neuritis Ear/Nose/Mouth/Throat Complaints and Symptoms: Positive for: Chronic sinus problems or rhinitis Medical History: Positive for: Chronic sinus problems/congestion - seasonal Negative for: Middle ear problems Respiratory Complaints and Symptoms: Negative for: Chronic or frequent coughs; Shortness of Breath Medical History: Negative for: Aspiration; Asthma; Chronic Obstructive Pulmonary Disease (COPD); Pneumothorax; Sleep Apnea; Tuberculosis Cardiovascular Complaints and Symptoms: Negative for: Chest pain Medical History: Negative for: Angina; Arrhythmia; Congestive Heart Failure; Coronary Artery Disease; Deep Vein Thrombosis; Hypertension; Hypotension; Myocardial Infarction; Peripheral Arterial Disease; Peripheral Venous Disease; Phlebitis; Vasculitis Past Medical History Notes: palpitations Gastrointestinal Complaints and Symptoms: Negative for: Frequent diarrhea; Nausea; Vomiting Medical History: Negative for: Cirrhosis ; Colitis; Crohns; Hepatitis A; Hepatitis B; Hepatitis C Endocrine Complaints and Symptoms: Negative for: Heat/cold intolerance PABEL, CIOLLI ([id number]) [id number] Page 8 of 9  within target range for patient.Marland Kitchen respirations regular, non-labored and within target range for patient.Marland Kitchen temperature within target range for patient.. Well-nourished and well-hydrated in no acute distress. Eyes conjunctiva clear no eyelid edema noted. pupils equal round and reactive to light and accommodation. Ears, Nose, Mouth, and Throat no gross abnormality of ear auricles or external auditory canals. normal hearing noted during conversation. mucus membranes moist. Respiratory normal breathing without difficulty. Musculoskeletal normal gait and posture. no significant deformity or arthritic changes, no loss or range of motion, no clubbing. Psychiatric this patient is able to make decisions and demonstrates good insight into disease process. Alert and Oriented x 3. pleasant and cooperative. Notes Fortunately I do not see any signs of active infection locally or systemically which is Excellent and in general I think that we are making good headway towards closure. I do believe that he has been doing very well I think that he may do well with Xeroform gauze due to the fact that this looks to be a little bit dry I would like to do something to try to keep this from drying out so much. He is in agreement with the plan we will give Xeroform to try and subsequently he will be using a waterproof bandage over top of this. Upon inspection patient's wound bed actually showed signs of good granulation epithelization at this point. Electronic Signature(s) Signed:  01/16/2023 11:04:35 AM By: Raymond Camacho, Raymond Camacho ([id number]) By: Raymond Camacho [phone].pdf Page 3 of 9 Signed: 01/16/2023 11:04:35 AM Entered By: Allen Derry on 01/16/2023 08:04:35 -------------------------------------------------------------------------------- Physician Orders Details Patient Name: Date of Service: Raymond Burn RD Camacho. 01/16/2023 9:30 A M Medical Record Number: [id number] Patient Account Number: [id number] Date of Birth/Sex: Treating RN: 09-12-1940 (82 y.o. Raymond Camacho Primary Care Provider: Sondra Camacho Other Clinician: Referring Provider: Treating Provider/Extender: Raymond Camacho in Treatment: 0 Verbal / Phone Orders: No Diagnosis Coding ICD-10 Coding Code Description [phone] Laceration without foreign body of left hand, initial encounter I10 Essential (primary) hypertension Follow-up Appointments ppointment in 1 week. Raymond Camacho Wednesday 01/23/2023 1230 room 7 Return A ppointment in 2 weeks. Raymond Camacho Wednesday 01/30/2023 0945 room 9 Return A Return appointment in 3 weeks. Raymond Camacho Wednesday room 9 02/06/2023 0945 Other: - Pick up over the counter waterproof Nex[employer] bandage knee and elbow to hold your dressing in place. Check out [employer]. Anesthetic (In clinic) Topical Lidocaine 4% applied to wound bed Bathing/ Shower/ Hygiene May shower and wash wound with soap and water. Wound Treatment Wound #1 - Hand - Web Between 2nd and 3rd Digit Wound Laterality: Left, Anterior Cleanser: Soap and Water 1 x Per Day/30 Days Discharge Instructions: May shower and wash wound with dial antibacterial soap and water prior to dressing change. Prim Dressing: Xeroform Occlusive Gauze Dressing, 4x4 in 1 x Per Day/30 Days ary Discharge Instructions: Apply to wound bed as instructed Secondary Dressing: bandaid 1 x Per Day/30 Days Discharge Instructions: apply over the primary dressing. Patient Medications llergies: Sulfa  (Sulfonamide Antibiotics) A Notifications Medication Indication Start End applied only in clinic for9/08/2022 lidocaine debridements. DOSE topical 4 % cream - cream topical once daily Electronic Signature(s) Signed: 01/16/2023 1:48:54 PM By: Allen Derry PA-C Signed: 01/16/2023 6:09:26 PM By: Shawn Stall RN, BSN Entered By: Shawn Stall on 01/16/2023 07:53:53 Raymond Camacho ([id number]) [id number] Page 4 of 9 -------------------------------------------------------------------------------- Problem List Details Patient Name: Date of Service: Raymond [address] Camacho. 01/16/2023 9:30 A M Medical Record Number:
